# Patient Record
Sex: Male | Born: 1955 | Race: White | Hispanic: No | Marital: Married | Smoking: Former smoker
Health system: Southern US, Community
[De-identification: ages and names within clinical notes are randomized; demographics above are authoritative.]

## PROBLEM LIST (undated history)

## (undated) DIAGNOSIS — G629 Polyneuropathy, unspecified: Secondary | ICD-10-CM

## (undated) DIAGNOSIS — I10 Essential (primary) hypertension: Secondary | ICD-10-CM

## (undated) DIAGNOSIS — C3490 Malignant neoplasm of unspecified part of unspecified bronchus or lung: Secondary | ICD-10-CM

## (undated) HISTORY — PX: SPINE SURGERY: SHX786

---

## 1998-10-26 ENCOUNTER — Emergency Department (HOSPITAL_COMMUNITY): Admission: EM | Admit: 1998-10-26 | Discharge: 1998-10-26 | Payer: Self-pay | Admitting: Emergency Medicine

## 2002-11-18 ENCOUNTER — Emergency Department (HOSPITAL_COMMUNITY): Admission: EM | Admit: 2002-11-18 | Discharge: 2002-11-18 | Payer: Self-pay | Admitting: Emergency Medicine

## 2013-06-13 ENCOUNTER — Emergency Department (HOSPITAL_COMMUNITY)
Admission: EM | Admit: 2013-06-13 | Discharge: 2013-06-13 | Disposition: A | Discharge status: Home / Self Care | Payer: Non-veteran care | Attending: Emergency Medicine | Admitting: Emergency Medicine

## 2013-06-13 ENCOUNTER — Encounter (HOSPITAL_COMMUNITY): Payer: Self-pay | Admitting: Emergency Medicine

## 2013-06-13 DIAGNOSIS — Z79899 Other long term (current) drug therapy: Secondary | ICD-10-CM | POA: Insufficient documentation

## 2013-06-13 DIAGNOSIS — G5600 Carpal tunnel syndrome, unspecified upper limb: Secondary | ICD-10-CM | POA: Insufficient documentation

## 2013-06-13 DIAGNOSIS — Z7982 Long term (current) use of aspirin: Secondary | ICD-10-CM | POA: Insufficient documentation

## 2013-06-13 DIAGNOSIS — I10 Essential (primary) hypertension: Secondary | ICD-10-CM | POA: Insufficient documentation

## 2013-06-13 DIAGNOSIS — F17200 Nicotine dependence, unspecified, uncomplicated: Secondary | ICD-10-CM | POA: Insufficient documentation

## 2013-06-13 DIAGNOSIS — G5601 Carpal tunnel syndrome, right upper limb: Secondary | ICD-10-CM

## 2013-06-13 HISTORY — DX: Essential (primary) hypertension: I10

## 2013-06-13 NOTE — ED Notes (Signed)
Pt complains of numbness that started today in his fingers and is now in his hand only on the left side, no other symptoms

## 2013-06-13 NOTE — Discharge Instructions (Signed)
You need an EMG/nerve conduction velocity test  Carpal Tunnel Syndrome The carpal tunnel is an area under the skin of the palm of your hand. Nerves, blood vessels, and strong tissues (tendons) pass through the tunnel. The tunnel can become puffy (swollen). If this happens, a nerve can be pinched in the wrist. This causes carpal tunnel syndrome.  HOME CARE  Take all medicine as told by your doctor.  If you were given a splint, wear it as told. Wear it at night or at times when your doctor told you to.  Rest your wrist from the activity that causes your pain.  Put ice on your wrist after long periods of wrist activity.  Put ice in a plastic bag.  Place a towel between your skin and the bag.  Leave the ice on for 15-20 minutes, 03-04 times a day.  Keep all doctor visits as told. GET HELP RIGHT AWAY IF:  You have new problems you cannot explain.  Your problems get worse and medicine does not help. MAKE SURE YOU:   Understand these instructions.  Will watch your condition.  Will get help right away if you are not doing well or get worse. Document Released: 03/13/2011 Document Revised: 06/16/2011 Document Reviewed: 03/13/2011 [facility] Patient Information 2014 [place], [place].  Carpal Tunnel Release Carpal tunnel release is done to relieve the pressure on the nerves and tendons on the bottom side of your wrist.  LET YOUR CAREGIVER KNOW ABOUT:   Allergies to food or medicine.  Medicines taken, including vitamins, herbs, eyedrops, over-the-counter medicines, and creams.  Use of steroids (by mouth or creams).  Previous problems with anesthetics or numbing medicines.  History of bleeding problems or blood clots.  Previous surgery.  Other health problems, including diabetes and kidney problems.  Possibility of pregnancy, if this applies. RISKS AND COMPLICATIONS  Some problems that may happen after this procedure include:  Infection.  Damage to the nerves, arteries or  tendons could occur. This would be very uncommon.  Bleeding. BEFORE THE PROCEDURE   This surgery may be done while you are asleep (general anesthetic) or may be done under a block where only your forearm and the surgical area is numb.  If the surgery is done under a block, the numbness will gradually wear off within several hours after surgery. HOME CARE INSTRUCTIONS   Have a responsible person with you for 24 hours.  Do not drive a car or use public transportation for 24 hours.  Only take over-the-counter or prescription medicines for pain, discomfort, or fever as directed by your caregiver. Take them as directed.  You may put ice on the palm side of the affected wrist.  Put ice in a plastic bag.  Place a towel between your skin and the bag.  Leave the ice on for 20 to 30 minutes, 4 times per day.  If you were given a splint to keep your wrist from bending, use it as directed. It is important to wear the splint at night or as directed. Use the splint for as long as you have pain or numbness in your hand, arm, or wrist. This may take 1 to 2 months.  Keep your hand raised (elevated) above the level of your heart as much as possible. This keeps swelling down and helps with discomfort.  Change bandages (dressings) as directed.  Keep the wound clean and dry. SEEK MEDICAL CARE IF:   You develop pain not relieved with medications.  You develop numbness of your  hand.  You develop bleeding from your surgical site.  You have an oral temperature above 102 F (38.9 C).  You develop redness or swelling of the surgical site.  You develop new, unexplained problems. SEEK IMMEDIATE MEDICAL CARE IF:   You develop a rash.  You have difficulty breathing.  You develop any reaction or side effects to medications given. Document Released: 06/14/2003 Document Revised: 06/16/2011 Document Reviewed: 01/28/2007 [facility] Patient Information 2014 [place].

## 2013-06-14 NOTE — ED Provider Notes (Signed)
CSN: [id number]     Arrival date & time 06/13/13  2048 History   First MD Initiated Contact with Patient 06/13/13 2157     Chief Complaint  Patient presents with  . Numbness      HPI Patient presents emergency department with chief complaint of numbness to his right hand which started this morning.  Initially started in his right thumb on the ulnar surface and then spread to the index and long finger and palmar aspect of his hand consistent with a median nerve distribution.  Patient denies any weakness any difficulty walking or speaking.  Patient denies any headache.  Her other symptoms of stroke or TIA.  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  He works in [employer].  Has a past medical history of hypertension and is a smoker with occasional use of alcohol. Past Medical History  Diagnosis Date  . Hypertension    History reviewed. No pertinent past surgical history. History reviewed. No pertinent family history. History  Substance Use Topics  . Smoking status: Current Every Day Smoker  . Smokeless tobacco: Not on file  . Alcohol Use: Yes    Review of Systems  Neurological: Negative for dizziness, seizures, facial asymmetry, speech difficulty, weakness and headaches.  All other systems reviewed and are negative.      Allergies  Review of patient's allergies indicates no known allergies.  Home Medications   Current Outpatient Rx  Name  Route  Sig  Dispense  Refill  . aspirin EC 81 MG tablet   Oral   Take 81 mg by mouth every morning.         Marland Kitchen lisinopril (PRINIVIL,ZESTRIL) 10 MG tablet   Oral   Take 10 mg by mouth daily.         . Multiple Vitamin (MULTIVITAMIN WITH MINERALS) TABS tablet   Oral   Take 1 tablet by mouth every morning.          BP 156/84  Pulse 77  Temp(Src) 98.5 F (36.9 C) (Oral)  Resp 18  Ht 6\' 1"  (1.854 m)  Wt 170 lb (77.111 kg)  BMI 22.43 kg/m2  SpO2 98% Physical Exam  Nursing note and vitals reviewed. Constitutional: He is oriented  to person, place, and time. He appears well-developed and well-nourished. No distress.  HENT:  Head: Normocephalic and atraumatic.  Eyes: Pupils are equal, round, and reactive to light.  Neck: Normal range of motion.  Cardiovascular: Normal rate and intact distal pulses.   Pulmonary/Chest: No respiratory distress.  Abdominal: Normal appearance. He exhibits no distension.  Musculoskeletal: Normal range of motion.  Neurological: He is alert and oriented to person, place, and time. He has normal strength. A sensory deficit (distribution of R median nerve) is present. No cranial nerve deficit. He displays a negative Romberg sign. Gait normal.  Skin: Skin is warm and dry. No rash noted.  Psychiatric: He has a normal mood and affect. His behavior is normal.    ED Course  Procedures (including critical care time) Labs Review Labs Reviewed - No data to display   The history and physical exam are consistent with carpal tunnel or median nerve entrapment.  Plan at this time is placed the patient in a wrist immobilizer with followup instructions for further workup including a EMG and nerve conduction velocity.   MDM   Final diagnoses:  Carpal tunnel syndrome on right        Dot Lanes, MD 06/14/13 [id number]

## 2014-06-09 ENCOUNTER — Ambulatory Visit (INDEPENDENT_AMBULATORY_CARE_PROVIDER_SITE_OTHER): Payer: Non-veteran care

## 2014-06-09 ENCOUNTER — Ambulatory Visit (INDEPENDENT_AMBULATORY_CARE_PROVIDER_SITE_OTHER): Payer: Non-veteran care | Admitting: Family Medicine

## 2014-06-09 VITALS — BP 138/86 | HR 78 | Temp 98.2°F | Resp 17 | Ht 71.0 in | Wt 176.0 lb

## 2014-06-09 DIAGNOSIS — Z720 Tobacco use: Secondary | ICD-10-CM | POA: Diagnosis not present

## 2014-06-09 DIAGNOSIS — M545 Low back pain, unspecified: Secondary | ICD-10-CM

## 2014-06-09 DIAGNOSIS — R1032 Left lower quadrant pain: Secondary | ICD-10-CM

## 2014-06-09 DIAGNOSIS — M25552 Pain in left hip: Secondary | ICD-10-CM

## 2014-06-09 DIAGNOSIS — M79605 Pain in left leg: Principal | ICD-10-CM

## 2014-06-09 LAB — POCT UA - MICROSCOPIC ONLY
Bacteria, U Microscopic: NEGATIVE
Casts, Ur, LPF, POC: NEGATIVE
Crystals, Ur, HPF, POC: NEGATIVE
EPITHELIAL CELLS, URINE PER MICROSCOPY: NEGATIVE
Mucus, UA: NEGATIVE
RBC, URINE, MICROSCOPIC: NEGATIVE
WBC, UR, HPF, POC: NEGATIVE
Yeast, UA: NEGATIVE

## 2014-06-09 LAB — POCT URINALYSIS DIPSTICK
Bilirubin, UA: NEGATIVE
Blood, UA: NEGATIVE
GLUCOSE UA: NEGATIVE
Ketones, UA: NEGATIVE
Leukocytes, UA: NEGATIVE
Nitrite, UA: NEGATIVE
Protein, UA: NEGATIVE
Spec Grav, UA: 1.01
UROBILINOGEN UA: 0.2
pH, UA: 7

## 2014-06-09 MED ORDER — CYCLOBENZAPRINE HCL 10 MG PO TABS: 10.0000 mg | ORAL_TABLET | Freq: Two times a day (BID) | ORAL | Status: DC | PRN

## 2014-06-09 MED ORDER — PREDNISONE 20 MG PO TABS: ORAL_TABLET | ORAL | Status: DC

## 2014-06-09 NOTE — Patient Instructions (Signed)
Use the prednisone as directed- while you are taking this no NSAID medications such as ibuprofen or aleve-  Tylenol is ok though Use the flexeril as needed for pain at night- it is a muscle relaxer and will make you sleepy, do not use it when you are driving Please let me know if you do not feel better in the next couple of days- Sooner if worse.

## 2014-06-09 NOTE — Progress Notes (Addendum)
[facility] [address], [place] [address] [phone]  Date:  06/09/2014   Name:  Gregory Chang   DOB:  [birth year]   MRN:  [id number]  PCP:  No primary care provider on file.    Chief Complaint: Back Pain and Groin Pain   History of Present Illness:  Gregory Chang is a 59 y.o. very pleasant male patient who presents with the following:  Here today as a new patient.  2 night ago he woke up to urinate- "it felt like I had pulled my left groin muscle."  He went back to bed, but the pain seemed to get worse while he was at work yesterday, and it seemed to move into his left hip and lower back.  He has noted some back pain for 5-6 weeks.   Laminectomy at L4/5 in the early 1990s.  Since then he has had intermittent back pain    He has not noted a groin bulge, but he does have pain when he coughs in his groin.  He has a known umbilical hernia hernia No pain with urination.   No blood in his urine.  He has never had a kidney stone.   No testicular pain.   No falls or other trauma.   No leg weakness or numbness, no bowel or bladder control concerns   He took a taper of prednisone which lasted 9 days, finished about 2 weeks ago.  He got this from a friend.  It did help while he was taking it  There are no active problems to display for this patient.   Past Medical History  Diagnosis Date  . Hypertension     Past Surgical History  Procedure Laterality Date  . Spine surgery      History  Substance Use Topics  . Smoking status: Current Every Day Smoker -- 1.00 packs/day for 45 years    Types: Cigarettes  . Smokeless tobacco: Not on file  . Alcohol Use: 0.0 oz/week    0 Standard drinks or equivalent per week    Family History  Problem Relation Age of Onset  . Hypertension Brother     No Known Allergies  Medication list has been reviewed and updated.  Current Outpatient Prescriptions on File Prior to Visit  Medication Sig Dispense  Refill  . aspirin EC 81 MG tablet Take 81 mg by mouth every morning.    Marland Kitchen lisinopril (PRINIVIL,ZESTRIL) 10 MG tablet Take 10 mg by mouth daily.    . Multiple Vitamin (MULTIVITAMIN WITH MINERALS) TABS tablet Take 1 tablet by mouth every morning.     No current facility-administered medications on file prior to visit.    Review of Systems:  As per HPI- otherwise negative.   Physical Examination: Filed Vitals:   06/09/14 1428  BP: 138/86  Pulse: 78  Temp: 98.2 F (36.8 C)  Resp: 17   Filed Vitals:   06/09/14 1428  Height: 5\' 11"  (1.803 m)  Weight: 176 lb (79.833 kg)   Body mass index is 24.56 kg/(m^2). Ideal Body Weight: Weight in (lb) to have BMI = 25: 178.9  GEN: WDWN, NAD, Non-toxic, A & O x 3, tall build, cigarette odor HEENT: Atraumatic, Normocephalic. Neck supple. No masses, No LAD. Ears and Nose: No external deformity. CV: RRR, No M/G/R. No JVD. No thrill. No extra heart sounds. PULM: CTA B, no wheezes, crackles, rhonchi. No retractions. No resp. distress. No accessory muscle use. ABD: S, NT, ND, +  BS. No rebound. No HSM.  Small umbilical hernia which is non- tender EXTR: No c/c/e NEURO Normal gait.  PSYCH: Normally interactive. Conversant. Not depressed or anxious appearing.  Calm demeanor.  Gu: no inguinal hernia. He does have some atrophy of the right testicle which he states is due to "epidimymitis a long time ago."  Otherwise normal testicles and penis.   He has mild tenderness over the left buttock/ sciatic notch area but negative bilateral SLR, normal spine flexion and extension, normal BLE strength, sensation and DTR   [facility] reading (PRIMARY) by  Dr. Lorelei Pont. Lumbar spine: s/p lumbar laminectomy.  Degenerative change worst at L5S1.  Left hip: negative  LEFT HIP (WITH PELVIS) 2-3 VIEWS  COMPARISON: None.  FINDINGS: Old bilateral obturator ring fractures are present. Suspect an old sacral fracture or as well. The pelvis appears narrow. There is mild LEFT  hip osteoarthritis with tiny marginal osteophytes. No acute fracture. Tiny atherosclerotic calcifications are present. L4-L5 disc space narrowing.  IMPRESSION: No acute osseous abnormality. 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 Mild LEFT hip osteoarthritis.  LUMBAR SPINE - COMPLETE 4+ VIEW  COMPARISON: None.  FINDINGS: Lumbar vertebrae are numbered with the lowest segmented appearing vertebral lateral views L5. This may be a transitional vertebra. No acute soft tissue bony abnormality identified. Mild diffuse degenerative change. Normal alignment. Aortoiliac atherosclerotic vascular disease.  IMPRESSION: 1. Diffuse osteopenia and degenerative change. No acute abnormality.  2. Aortoiliac atherosclerotic vascular disease.  Results for orders placed or performed in visit on 06/09/14  Urine culture  Result Value Ref Range   Colony Count NO GROWTH    Organism ID, Bacteria NO GROWTH   POCT UA - Microscopic Only  Result Value Ref Range   WBC, Ur, HPF, POC neg    RBC, urine, microscopic neg    Bacteria, U Microscopic neg    Mucus, UA neg    Epithelial cells, urine per micros neg    Crystals, Ur, HPF, POC neg    Casts, Ur, LPF, POC neg    Yeast, UA neg   POCT urinalysis dipstick  Result Value Ref Range   Color, UA yellow    Clarity, UA clear    Glucose, UA neg    Bilirubin, UA neg    Ketones, UA neg    Spec Grav, UA 1.010    Blood, UA neg    pH, UA 7.0    Protein, UA neg    Urobilinogen, UA 0.2    Nitrite, UA neg    Leukocytes, UA Negative      Assessment and Plan: Low back pain radiating to left lower extremity - Plan: DG Lumbar Spine Complete, predniSONE (DELTASONE) 20 MG tablet, cyclobenzaprine (FLEXERIL) 10 MG tablet  Pain in left hip - Plan: DG HIP UNILAT WITH PELVIS 2-3 VIEWS LEFT  Pain in the groin, left - Plan: POCT UA - Microscopic Only, POCT urinalysis dipstick, Urine culture  Lower back pain with some radiation to the left leg, although no weakness  or numbness.  Treat with a prednisone taper, flexeril as needed for pain.  He will follow-up if not feeling better soon Urine is very benign so doubt kidney stone or urinary tract infection Groin pain: at this time no evidence of an inguinal hernia, but tension a the inguinal ring seems the likely cause of his discomfort.  This is now improved and he will watch for recurrence. Warned regarding signs of strangulation or incarceration   Signed Lamar Blinks, MD  Send copy of  results to his PCP, Dr. Dell Ponto, with the [facility] in [place]

## 2014-06-10 ENCOUNTER — Encounter: Payer: Self-pay | Admitting: Family Medicine

## 2014-06-10 LAB — URINE CULTURE
Colony Count: NO GROWTH
Organism ID, Bacteria: NO GROWTH

## 2014-06-15 ENCOUNTER — Ambulatory Visit (INDEPENDENT_AMBULATORY_CARE_PROVIDER_SITE_OTHER): Payer: Non-veteran care | Admitting: Family Medicine

## 2014-06-15 VITALS — BP 156/74 | HR 86 | Temp 97.9°F | Resp 16 | Ht 71.0 in | Wt 184.0 lb

## 2014-06-15 DIAGNOSIS — M545 Low back pain, unspecified: Secondary | ICD-10-CM

## 2014-06-15 DIAGNOSIS — R202 Paresthesia of skin: Secondary | ICD-10-CM | POA: Diagnosis not present

## 2014-06-15 DIAGNOSIS — M79605 Pain in left leg: Principal | ICD-10-CM

## 2014-06-15 DIAGNOSIS — R208 Other disturbances of skin sensation: Secondary | ICD-10-CM | POA: Diagnosis not present

## 2014-06-15 DIAGNOSIS — R200 Anesthesia of skin: Secondary | ICD-10-CM

## 2014-06-15 LAB — GLUCOSE, POCT (MANUAL RESULT ENTRY): POC Glucose: 130 mg/dl — AB (ref 70–99)

## 2014-06-15 MED ORDER — PREDNISONE 20 MG PO TABS: ORAL_TABLET | ORAL | Status: DC

## 2014-06-15 NOTE — Patient Instructions (Addendum)
Your symptoms still appear to be coming from your back. Your strength and reflexes in the legs appear to be ok/equal today, and I do not see any swelling in your legs.  We can try a higher dose of prednisone for the next few days, then tapering down. If your leg symptoms are not improving in next 2 or 3 days, or any worsening of your symptoms - return here or emergency room. If you have any numbness in groin, loss of control of urine or stool - go directly to an emergency room. You may need an MRI of your spine or back specialist to see you if not improving, and we can refer you for this, but may be best if your primary provider coordinates this.  THe contact information I obtained for Dr. Henrene Pastor was [phone], extension [phone].   Blood sugar slightly elevated in office, but may be ok if not fasting. Follow up with your primary provider to recheck this fasting.   Return to the clinic or go to the nearest emergency room if any of your symptoms worsen or new symptoms occur.

## 2014-06-15 NOTE — Progress Notes (Addendum)
Subjective:    Patient ID: Gregory Chang, male    DOB: April 28, 1955, 59 y.o.   MRN: [id number] This chart was scribed for Merri Ray, MD by Zola Button, Medical Scribe. This patient was seen in Room 11 and the patient's care was started at 12:23 PM.    HPI HPI Comments: Gregory Chang is a 59 y.o. male who presents to the [facility] complaining of left leg numbness.  He was seen by Dr. Lorelei Pont 6 days ago for low back pain to the left of the hip and leg pain. Hx of laminectomy of L4/L5 in 1990s. He is also having some pain in the groin with coughing. Per her note, he had also taken a 9 day taper of prednisone 2 weeks prior that helped his symptoms. Reported back pain for 5-6 weeks. He had XRs of his left hip and spine that showed old pelvic fractures and mild left hip OA. Normal UA. He was treated with a prednisone taper and flexeril as well as precautions for a possible inguinal hernia. Copy of results from last visit sent to PCP Dr. Henrene Pastor with a [facility] in [place].  He states that now his left leg has gone numb, including his toes and groin, as of yesterday with soreness to the front of the lower part of the leg. The numbness is worse in the lower part of his leg. He did have some leg numbness after his laminectomy, but he states he did not have numbness to the whole leg. Patient also has some weakness, stating that he cannot control the muscles of his leg. The prednisone and muscle relaxer did not really help his symptoms. Patient denies loss of bladder or bowel control. He also denies hx of blood clots and FMHx of blood clots.  Review of Systems  Neurological: Positive for weakness and numbness.       Objective:   Physical Exam  Constitutional: He is oriented to person, place, and time. He appears well-developed and well-nourished. No distress.  HENT:  Head: Normocephalic and atraumatic.  Mouth/Throat: Oropharynx is clear and moist. No oropharyngeal  exudate.  Eyes: Pupils are equal, round, and reactive to light.  Neck: Neck supple.  Cardiovascular: Normal rate.   Pulmonary/Chest: Effort normal.  Genitourinary:  Slight discomfort along the medial left thigh, but no hernia palpated in the groin.  Musculoskeletal: He exhibits tenderness. He exhibits no edema.  Unable to heel walk on the left leg, but is able to toe walk. Tender along left sciatic notch. Flexion slightly limited at 90 degrees, otherwise normal ROM. Calves nontender. Homans negative. Calves circumference overall equal: 36 cm on the left, 37 cm on the right at 15 cm below the patella.  Neurological: He is alert and oriented to person, place, and time. No cranial nerve deficit.  Patellar reflexes 2+ bilaterally. Achilles reflexes 1+ bilaterally. Babinski negative bilaterally. Slight decreased sensation to tips of toes on left foot, but DP pulse 2+ and cap refill less than 1 second.    Skin: Skin is warm and dry. No rash noted.  Well healed scar on the lower lumbar spine.  Psychiatric: He has a normal mood and affect. His behavior is normal.  Nursing note and vitals reviewed.  Filed Vitals:   06/15/14 1058  BP: 156/74  Pulse: 86  Temp: 97.9 F (36.6 C)  TempSrc: Oral  Resp: 16  Height: 5\' 11"  (1.803 m)  Weight: 184 lb (83.462 kg)  SpO2: 98%  Results for orders placed or performed in visit on 06/15/14  POCT glucose (manual entry)  Result Value Ref Range   POC Glucose 130 (A) 70 - 99 mg/dl  nonfasting.      Assessment & Plan:   Gregory Chang is a 59 y.o. male Low back pain radiating to left leg - Plan: predniSONE (DELTASONE) 20 MG tablet  Numbness in left leg - Plan: POCT glucose (manual entry), predniSONE (DELTASONE) 20 MG tablet  Numbness and tingling of left leg - Plan: POCT glucose (manual entry)  Still suspected lumbar source with radiculopathy. Has strength with testing and ambulating ok - discomfort with heel walk, but reflexes equal, pulses  intact distally, no calf swelling or pain. No cauda equina sx's.   -trial of increased prednisone dose temporarily., has flexeril if needed.   -if not improving with this dose, or worsening sooner - RTC or ER. Cauda equina precuations discussed.   -may need MRI, but initial prednisone increase trail as above.   -follow up with PCP re: glucose, but not fasting today.   Meds ordered this encounter  Medications  . predniSONE (DELTASONE) 20 MG tablet    Sig: 3 by mouth for 2 days, then 2 by mouth for 2 days, then 1 by mouth for 2 days, then 1/2 by mouth for 2 days.    Dispense:  13 tablet    Refill:  0   Patient Instructions  Your symptoms still appear to be coming from your back. Your strength and reflexes in the legs appear to be ok/equal today, and I do not see any swelling in your legs.  We can try a higher dose of prednisone for the next few days, then tapering down. If your leg symptoms are not improving in next 2 or 3 days, or any worsening of your symptoms - return here or emergency room. If you have any numbness in groin, loss of control of urine or stool - go directly to an emergency room. You may need an MRI of your spine or back specialist to see you if not improving, and we can refer you for this, but may be best if your primary provider coordinates this.  THe contact information I obtained for Dr. Henrene Pastor was [phone], extension [phone].   Blood sugar slightly elevated in office, but may be ok if not fasting. Follow up with your primary provider to recheck this fasting.   Return to the clinic or go to the nearest emergency room if any of your symptoms worsen or new symptoms occur.      I personally performed the services described in this documentation, which was scribed in my presence. The recorded information has been reviewed and considered, and addended by me as needed.

## 2014-12-17 ENCOUNTER — Encounter (HOSPITAL_BASED_OUTPATIENT_CLINIC_OR_DEPARTMENT_OTHER): Payer: Self-pay | Admitting: *Deleted

## 2014-12-17 ENCOUNTER — Emergency Department (HOSPITAL_BASED_OUTPATIENT_CLINIC_OR_DEPARTMENT_OTHER)
Admission: EM | Admit: 2014-12-17 | Discharge: 2014-12-17 | Disposition: A | Discharge status: Home / Self Care | Payer: Non-veteran care | Attending: Emergency Medicine | Admitting: Emergency Medicine

## 2014-12-17 DIAGNOSIS — Y9389 Activity, other specified: Secondary | ICD-10-CM | POA: Diagnosis not present

## 2014-12-17 DIAGNOSIS — Y9289 Other specified places as the place of occurrence of the external cause: Secondary | ICD-10-CM | POA: Insufficient documentation

## 2014-12-17 DIAGNOSIS — I10 Essential (primary) hypertension: Secondary | ICD-10-CM | POA: Insufficient documentation

## 2014-12-17 DIAGNOSIS — Z87891 Personal history of nicotine dependence: Secondary | ICD-10-CM | POA: Diagnosis not present

## 2014-12-17 DIAGNOSIS — Z79899 Other long term (current) drug therapy: Secondary | ICD-10-CM | POA: Insufficient documentation

## 2014-12-17 DIAGNOSIS — Y998 Other external cause status: Secondary | ICD-10-CM | POA: Diagnosis not present

## 2014-12-17 DIAGNOSIS — S80852A Superficial foreign body, left lower leg, initial encounter: Secondary | ICD-10-CM

## 2014-12-17 DIAGNOSIS — W01198A Fall on same level from slipping, tripping and stumbling with subsequent striking against other object, initial encounter: Secondary | ICD-10-CM | POA: Diagnosis not present

## 2014-12-17 MED ORDER — CEPHALEXIN 500 MG PO CAPS: 500.0000 mg | ORAL_CAPSULE | Freq: Three times a day (TID) | ORAL | Status: DC

## 2014-12-17 MED ORDER — LIDOCAINE-EPINEPHRINE (PF) 2 %-1:200000 IJ SOLN
20.0000 mL | Freq: Once | INTRAMUSCULAR | Status: AC
  Administered 2014-12-17: 20 mL
  Filled 2014-12-17: qty 20

## 2014-12-17 NOTE — ED Notes (Signed)
Patient states that he fell and a stick punctured his left lower leg lower lateral leg

## 2014-12-17 NOTE — ED Provider Notes (Signed)
CSN: [id number]     Arrival date & time 12/17/14  5465 History   First MD Initiated Contact with Patient 12/17/14 [phone]     Chief Complaint  Patient presents with  . Puncture Wound      HPI Patient presents to the emergency department with complaints of foreign body in his left lower extremity.  He was working in the bushes when he tripped and fell in a piece of the branch got stuck in his lower leg.  Emergency physician on scene attempted to remove the foreign body however she was unsuccessful to completely remove this and thus the patient was sent to the ER for further evaluation.  Patient denies numbness tingling or weakness of his left lower extremity.   Past Medical History  Diagnosis Date  . Hypertension    Past Surgical History  Procedure Laterality Date  . Spine surgery     Family History  Problem Relation Age of Onset  . Hypertension Brother    Social History  Substance Use Topics  . Smoking status: Former Smoker -- 1.00 packs/day for 45 years  . Smokeless tobacco: None  . Alcohol Use: 0.0 oz/week    0 Standard drinks or equivalent per week    Review of Systems  All other systems reviewed and are negative.     Allergies  Review of patient's allergies indicates no known allergies.  Home Medications   Prior to Admission medications   Medication Sig Start Date End Date Taking? Authorizing Provider  GABAPENTIN, ONCE-DAILY, PO Take by mouth.   Yes Historical Provider, MD  NAPROXEN PO Take by mouth.   Yes Historical Provider, MD  tamsulosin (FLOMAX) 0.4 MG CAPS capsule Take 0.4 mg by mouth.   Yes Historical Provider, MD  aspirin EC 81 MG tablet Take 81 mg by mouth every morning.    Historical Provider, MD  cephALEXin (KEFLEX) 500 MG capsule Take 1 capsule (500 mg total) by mouth 3 (three) times daily. 12/17/14   Jola Schmidt, MD  cyclobenzaprine (FLEXERIL) 10 MG tablet Take 1 tablet (10 mg total) by mouth 2 (two) times daily as needed for muscle spasms. 06/09/14    Gay Filler Copland, MD  lisinopril (PRINIVIL,ZESTRIL) 10 MG tablet Take 10 mg by mouth daily.    Historical Provider, MD  Multiple Vitamin (MULTIVITAMIN WITH MINERALS) TABS tablet Take 1 tablet by mouth every morning.    Historical Provider, MD  predniSONE (DELTASONE) 20 MG tablet 3 by mouth for 2 days, then 2 by mouth for 2 days, then 1 by mouth for 2 days, then 1/2 by mouth for 2 days. 06/15/14   Wendie Agreste, MD   BP 175/88 mmHg  Pulse 70  Temp(Src) 98.3 F (36.8 C) (Oral)  Resp 18  SpO2 99% Physical Exam  Constitutional: He is oriented to person, place, and time. He appears well-developed and well-nourished.  HENT:  Head: Normocephalic.  Eyes: EOM are normal.  Neck: Normal range of motion.  Pulmonary/Chest: Effort normal.  Abdominal: He exhibits no distension.  Musculoskeletal: Normal range of motion.  Normal pulses left foot.  Foreign body in the left lateral lower aspect of his lower leg.  No bleeding.  No surrounding erythema  Neurological: He is alert and oriented to person, place, and time.  Psychiatric: He has a normal mood and affect.  Nursing note and vitals reviewed.   ED Course  FOREIGN BODY REMOVAL Performed by: Jola Schmidt Authorized by: Jola Schmidt Consent: Verbal consent obtained. Risks and benefits: risks,  benefits and alternatives were discussed Consent given by: patient Time out: Immediately prior to procedure a "time out" was called to verify the correct patient, procedure, equipment, support staff and site/side marked as required. Intake: left lower leg. Anesthesia: local infiltration Local anesthetic: lidocaine 2% with epinephrine Anesthetic total: 5 ml Complexity: complex 1 objects recovered. Objects recovered: wood Post-procedure assessment: foreign body removed Patient tolerance: Patient tolerated the procedure well with no immediate complications Comments: 3cm surgical incision made to remove FB. Bleeding controlled. Single 3-0 prolene  stitch placed to close the gaping surgical wound. Irrigated extensively at the bedside   (including critical care time) Labs Review Labs Reviewed - No data to display  Imaging Review No results found. I have personally reviewed and evaluated these images and lab results as part of my medical decision-making.   EKG Interpretation None      MDM   Final diagnoses:  Foreign body in left lower extremity, initial encounter   Foreign body removed.  Infection warnings given.  Surgical incision to cut down to remove foreign body.  Single stitch placed to close the significant gaping wound caused by the surgical incision.  There is still ample room if any pus were to accumulate for it to drain.  This was irrigated extensively at the bedside.  Placed on antibiotics.    Jola Schmidt, MD 12/17/14 1041

## 2014-12-17 NOTE — Discharge Instructions (Signed)
Sliver Removal You have had a sliver (splinter) removed. This has caused a wound that extends through some or all layers of the skin and possibly into the subcutaneous tissue. This is the tissue just beneath the skin. Because these wounds can not be cleaned well, it is necessary to watch closely for infection. AFTER THE PROCEDURE  If a cut (incision) was necessary to remove this, it may have been repaired for you by your caregiver either with suturing, stapling, or adhesive strips. These keep together the skin edges and allow better and faster healing. HOME CARE INSTRUCTIONS   A dressing may have been applied. This may be changed once per day or as instructed. If the dressing sticks, it may be soaked off with a gauze pad or clean cloth that has been dampened with soapy water or hydrogen peroxide.  It is difficult to remove all slivers or foreign bodies as they may break or splinter into smaller pieces. Be aware that your body will work to remove the foreign substance. That is, the foreign body may work itself out of the wound. That is normal.  Watch for signs of infection and notify your caregiver if you suspect a sliver or foreign body remains in the wound.  You may have received a recommendation to follow up with your physician or a specialist. It is very important to call for or keep follow-up appointments in order to avoid infection or other complications.  Only take over-the-counter or prescription medicines for pain, discomfort, or fever as directed by your caregiver.  If antibiotics were prescribed, be sure to finish all of the medicine. If you did not receive a tetanus shot today because you did not recall when your last one was given, check with your caregiver in the next day or two during follow up to determine if one is needed. SEEK MEDICAL CARE IF:   The area around the wound has new or worsening redness or tenderness.  Pus is coming from the wound  There is a foul smell from the  wound or dressing  The edges of a wound that had been repaired break open SEEK IMMEDIATE MEDICAL CARE IF:   Red streaks are coming from the wound  An unexplained oral temperature above 102 F (38.9 C) develops. Document Released: 03/21/2000 Document Revised: 06/16/2011 Document Reviewed: 11/08/2007 [facility] Patient Information 2015 [place], [place]. This information is not intended to replace advice given to you by your health care provider. Make sure you discuss any questions you have with your health care provider.

## 2016-02-05 ENCOUNTER — Other Ambulatory Visit: Payer: Self-pay | Admitting: Radiation Oncology

## 2016-02-05 ENCOUNTER — Ambulatory Visit
Admission: RE | Admit: 2016-02-05 | Discharge: 2016-02-05 | Disposition: A | Discharge status: Home / Self Care | Payer: Non-veteran care | Source: Ambulatory Visit | Attending: Radiation Oncology | Admitting: Radiation Oncology

## 2016-02-05 DIAGNOSIS — C801 Malignant (primary) neoplasm, unspecified: Secondary | ICD-10-CM

## 2016-02-07 NOTE — Progress Notes (Signed)
Thoracic Location of Tumor / Histology:   Left Supraclavicular  Lymph node core biopsy (Stage IV lung ca) Large mediastinal mass , Right lower lobe, bi lateral neck   Patient presented  months ago with symptoms of: persist ant cough,  hemoptysis  ,   Biopsies of  (if applicable) revealed: Left lymph node left supraclavicular core bx: Metastatic squamous cell carcinoma   Tobacco/Marijuana/Snuff/ETOH use: 1ppd x 45 years quit 11/2014, quit alcohol 10/2015,  Smokes marijuana  Occasionally  1.5 week to help him sleep  Past/Anticipated interventions by cardiothoracic surgery, if any:   Past/Anticipated interventions by medical oncology, if any:  Dr. Adolm Joseph [facility], [facility]  =[facility] plan, [place] with Gemcitabine day 1& 8 every 3 weeks, palliative on hold till  Radiation consult  Signs/Symptoms  Weight changes, if any loss , not ealting well, fatigued  Respiratory complaints, if any: shortness breath mild exertion, mostly dry congestive cough    Hemoptysis, if any: yes 1/4 cup over past 12 hours   Pain issues, if any: generalized   SAFETY ISSUES: NO  Prior radiation? No  Pacemaker/ICD?  NO  Is the patient on methotrexate? NO  Current Complaints / other details:  [facility] referral from [employer] ,  For consideration palliative radiation prior to starting chemotherapy  Allergies:NKA  BP 103/60 (BP Location: Left Arm, Patient Position: Sitting, Cuff Size: Normal)   Pulse 75   Temp 98.2 F (36.8 C) (Oral)   Resp 20   Ht '5\' 11"'$  (1.803 m)   Wt 179 lb 12.8 oz (81.6 kg)   SpO2 99%   BMI 25.08 kg/m   Wt Readings from Last 3 Encounters:  02/11/16 179 lb 12.8 oz (81.6 kg)  06/15/14 184 lb (83.5 kg)  06/09/14 176 lb (79.8 kg)

## 2016-02-11 ENCOUNTER — Encounter: Payer: Self-pay | Admitting: Radiation Oncology

## 2016-02-11 ENCOUNTER — Ambulatory Visit
Admission: RE | Admit: 2016-02-11 | Discharge: 2016-02-11 | Disposition: A | Discharge status: Home / Self Care | Payer: Non-veteran care | Source: Ambulatory Visit | Attending: Radiation Oncology | Admitting: Radiation Oncology

## 2016-02-11 VITALS — BP 103/60 | HR 75 | Temp 98.2°F | Resp 20 | Ht 71.0 in | Wt 179.8 lb

## 2016-02-11 DIAGNOSIS — R0602 Shortness of breath: Secondary | ICD-10-CM | POA: Diagnosis not present

## 2016-02-11 DIAGNOSIS — Z23 Encounter for immunization: Secondary | ICD-10-CM | POA: Diagnosis not present

## 2016-02-11 DIAGNOSIS — C3431 Malignant neoplasm of lower lobe, right bronchus or lung: Secondary | ICD-10-CM

## 2016-02-11 DIAGNOSIS — Z510 Encounter for antineoplastic radiation therapy: Secondary | ICD-10-CM | POA: Diagnosis present

## 2016-02-11 DIAGNOSIS — C3481 Malignant neoplasm of overlapping sites of right bronchus and lung: Secondary | ICD-10-CM

## 2016-02-11 DIAGNOSIS — Z79899 Other long term (current) drug therapy: Secondary | ICD-10-CM | POA: Insufficient documentation

## 2016-02-11 DIAGNOSIS — Z7982 Long term (current) use of aspirin: Secondary | ICD-10-CM | POA: Insufficient documentation

## 2016-02-11 HISTORY — DX: Malignant neoplasm of unspecified part of unspecified bronchus or lung: C34.90

## 2016-02-11 NOTE — Progress Notes (Signed)
Radiation Oncology         [phone] ________________________________  Name: Gregory Chang MRN: [id number]  Date: 02/11/2016  DOB: 1955-04-15  CC:No primary care provider on file.  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 A diagnosis of Malignant neoplasm of overlapping sites of right lung [facility]) was also pertinent to this visit.  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 male seen at the request of Dr. Manuella Ghazi for a new diagnosis of lung cancer.  The patient has a long history of tobacco abuse. In May 2017, the patient had a worsening cough, increased shortness of breath, and a 10 lb weight loss. Chest X-ray was abnormal. Workup CT of the chest (on 01/03/16) revealed extensive mediastinal and hilar lymphadenopathy with mucosal breakthrough at the carina and bronchus intermedius, a right lower lobe mass (1.7 x 2.3 x 3.1 cm), left supraclavicular lymphadenopathy, and scattered tree in bud abnormalities in the RUL and RML with obstruction of the RLL bronchus.  This prompted a PET scan on 01/18/16. This showed a large central right perihilar mass measuring 3.7 x 5.9 cm (SUV max 39.7), a pulmonary nodule in the medial aspect of the superior segment of the RLL measuring 1.6 x 2.6 cm (SUV max 84.5), hypermetabolic subcarinal adenopathy measuring 3.1 x 3.5 cm (SUV max 24.5) right paratracheal adenopathy measuring 4.3 x 6.5 cm (SUV max 27.0), anterior mediastinal adenopathy measuring 1.6 x 2.3 cm (SUV max 29.7), and bilateral cervical and supraclavicular adenopathy with the largest node being a left supraclavicular node measuring 1.9 x 2.9 cm (SUV max 26.4). An enlarged left supraclavicular node was biopsied on 01/23/16 revealed metastatic squamous cell carcinoma, consistent with  lung origin. PDL1 results are not available at this time. MRI of the brain on 01/23/16 did not show metastasis.  He has met with Dr. Manuella Ghazi in medical oncology at the [facility] and has plans to begin carboplatin/gemcitabine chemotherapy once he completes palliative radiation due to his hemoptysis. He and his wife present today to discuss radiation with Dr. Lisbeth Renshaw.  PREVIOUS RADIATION THERAPY: No   PAST MEDICAL HISTORY:  Past Medical History:  Diagnosis Date  . Hypertension   . Lung cancer ([place])    supraclavicuoar node =metastatic squam,ous cell lung       PAST SURGICAL HISTORY: Past Surgical History:  Procedure Laterality Date  . SPINE SURGERY       FAMILY HISTORY:  Family History  Problem Relation Age of Onset  . Hypertension Brother      SOCIAL HISTORY:  reports that he has quit smoking. He has a 45.00 pack-year smoking history. He does not have any smokeless tobacco history on file. He reports that he drinks alcohol. He reports that he uses drugs about 1 time per week. The patient is retired and lives in [place]. He is accompanied by his wife.   ALLERGIES: Patient has no known allergies.   MEDICATIONS:  Current Outpatient Prescriptions  Medication Sig Dispense Refill  . albuterol (PROVENTIL HFA;VENTOLIN HFA) 108 (90 Base) MCG/ACT inhaler Inhale into the lungs every 6 (six) hours as needed for wheezing or shortness of breath.    Marland Kitchen amLODipine (NORVASC) 5 MG tablet Take 5 mg by mouth daily.    Marland Kitchen amoxicillin-clavulanate (AUGMENTIN) 875-125 MG tablet Take 1 tablet by  mouth 2 (two) times daily.    . codeine 30 MG tablet Take 30 mg by mouth every 6 (six) hours as needed. Take 1 tab q 6 hours prn, take only if having cough symptoms and coughing up blood    . HEPATITIS A-HEP B RECOMB VAC IM Inject 1 mL into the muscle. 70m  IM  For 3 doses,ive 3 doses at 0,1,and 6 months    . vitamin C (ASCORBIC ACID) 500 MG tablet Take 500 mg by mouth daily.    .Marland Kitchenaspirin EC 81 MG tablet  Take 81 mg by mouth every morning.    . cyclobenzaprine (FLEXERIL) 10 MG tablet Take 1 tablet (10 mg total) by mouth 2 (two) times daily as needed for muscle spasms. 30 tablet 0  . GABAPENTIN, ONCE-DAILY, PO Take 2 capsules by mouth 3 (three) times daily. 2 tabs twice a day and bedtime    . Melatonin 10 MG TABS Take 1 tablet by mouth at bedtime as needed.    . Multiple Vitamin (MULTIVITAMIN WITH MINERALS) TABS tablet Take 1 tablet by mouth every morning.    .Marland KitchenNAPROXEN PO Take 220 mg by mouth as needed.     . predniSONE (DELTASONE) 20 MG tablet 3 by mouth for 2 days, then 2 by mouth for 2 days, then 1 by mouth for 2 days, then 1/2 by mouth for 2 days. 13 tablet 0  . tamsulosin (FLOMAX) 0.4 MG CAPS capsule Take 0.4 mg by mouth.     No current facility-administered medications for this encounter.      REVIEW OF SYSTEMS: On review of systems, the patient reports that he is doing okay overall. He reports generalized pain, a 2 week history of hemoptysis of about 200 cc (of blood and sputum) over the past 12 hours, shortness of breath with mild exertion, a dry congestive cough, loss of appetite, weight loss, difficulty swallowing, and fatigue. Denies fevers, chills, or night sweats. He denies any bowel or bladder disturbances, and denies abdominal pain, nausea, or vomiting. He denies any new musculoskeletal or joint aches or pains. A complete review of systems is obtained and is otherwise negative.   PHYSICAL EXAM:  Wt Readings from Last 3 Encounters:  02/11/16 179 lb 12.8 oz (81.6 kg)  06/15/14 184 lb (83.5 kg)  06/09/14 176 lb (79.8 kg)   Temp Readings from Last 3 Encounters:  02/11/16 98.2 F (36.8 C) (Oral)  12/17/14 98.3 F (36.8 C) (Oral)  06/15/14 97.9 F (36.6 C) (Oral)   BP Readings from Last 3 Encounters:  02/11/16 103/60  12/17/14 175/88  06/15/14 (!) 156/74   Pulse Readings from Last 3 Encounters:  02/11/16 75  12/17/14 70  06/15/14 86     Pain scale 0/10 In general  this is a chronically ill appearing Caucasian male in no acute distress. He is alert and oriented x4 and appropriate throughout the examination. HEENT reveals that the patient is normocephalic, atraumatic. EOMs are intact. PERRLA. Skin is intact without any evidence of gross lesions. Cardiovascular exam reveals a regular rate and rhythm, no clicks rubs or murmurs are auscultated. Chest is clear to auscultation bilaterally.  There is a fluctuant lesion on the upper back along the midline consistent with a carbuncle. No punctate center is noted. Lymphatic assessment is performed and reveals shotty adenopathy in the left supraclavicular and cervical regions. No other palpable adenopathy is noted of the axillary or inguinal regions. Abdomen has active bowel sounds in all quadrants and is intact.  The abdomen is soft, non tender, non distended. Lower extremities are negative for pretibial pitting edema, deep calf tenderness, cyanosis or clubbing.  ECOG = 1  0 - Asymptomatic (Fully active, able to carry on all predisease activities without restriction)  1 - Symptomatic but completely ambulatory (Restricted in physically strenuous activity but ambulatory and able to carry out work of a light or sedentary nature. For example, light housework, office work)  2 - Symptomatic, <50% in bed during the day (Ambulatory and capable of all self care but unable to carry out any work activities. Up and about more than 50% of waking hours)  3 - Symptomatic, >50% in bed, but not bedbound (Capable of only limited self-care, confined to bed or chair 50% or more of waking hours)  4 - Bedbound (Completely disabled. Cannot carry on any self-care. Totally confined to bed or chair)  5 - Death   Eustace Pen MM, Creech RH, Tormey DC, et al. [phone]). "Toxicity and response criteria of the [facility]". [place] Oncol. 5 (6): 649-55    LABORATORY DATA:  No results found for: WBC, HGB, HCT, MCV, PLT No results  found for: NA, K, CL, CO2 No results found for: ALT, AST, GGT, ALKPHOS, BILITOT    RADIOGRAPHY: No results found.     IMPRESSION/PLAN: 1. 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 Dr. Lisbeth Renshaw met with the  patient and family about the findings and work-up thus far.  We discussed the natural history of stage IV lung cancer and general treatment, highlighting the role of radiotherapy in the management.  We discussed the available radiation techniques, and focused on the details of logistics and delivery. Dr. Lisbeth Renshaw has recommended 2 weeks of palliative radiation (10 fractions) to the mediastinum/right hilar region to help relieve his hemoptysis.  We reviewed the anticipated acute and late sequelae associated with radiation in this setting. The patient signed a consent form and we retained a copy for our records.  The patient would like to proceed with radiation and has been scheduled for CT simulation this afternoon at 1PM and begin radiation later this week. 2. Carbuncle. The patient will complete his antibiotic course with Augmentin, and keep [place] informed of any progressive symptoms.   The above documentation reflects my direct findings during this shared patient visit. Please see the separate note by Dr. Lisbeth Renshaw on this date for the remainder of the patient's plan of care.    Carola Rhine, PAC  This document serves as a record of services personally performed by Shona Simpson, PA-C and Kyung Rudd, MD. It was created on their behalf by Darcus Austin, a trained medical scribe. The creation of this record is based on the scribe's personal observations and the providers' statements to them. This document has been checked and approved by the attending provider.

## 2016-02-11 NOTE — Progress Notes (Signed)
Please see the Nurse Progress Note in the MD Initial Consult Encounter for this patient. 

## 2016-02-12 NOTE — Addendum Note (Signed)
Encounter addended by: Hayden Pedro, PA-C on: 02/12/2016  4:07 PM<BR>    Actions taken: Sign clinical note

## 2016-02-13 ENCOUNTER — Inpatient Hospital Stay
Admission: RE | Admit: 2016-02-13 | Discharge: 2016-02-13 | Disposition: A | Discharge status: Home / Self Care | Payer: Self-pay | Source: Ambulatory Visit | Attending: Radiation Oncology | Admitting: Radiation Oncology

## 2016-02-13 ENCOUNTER — Ambulatory Visit
Admission: RE | Admit: 2016-02-13 | Discharge: 2016-02-13 | Disposition: A | Discharge status: Home / Self Care | Payer: Non-veteran care | Source: Ambulatory Visit | Attending: Radiation Oncology | Admitting: Radiation Oncology

## 2016-02-13 ENCOUNTER — Encounter: Payer: Self-pay | Admitting: Radiation Oncology

## 2016-02-13 DIAGNOSIS — C3481 Malignant neoplasm of overlapping sites of right bronchus and lung: Secondary | ICD-10-CM

## 2016-02-13 DIAGNOSIS — Z510 Encounter for antineoplastic radiation therapy: Secondary | ICD-10-CM | POA: Diagnosis not present

## 2016-02-13 NOTE — Progress Notes (Signed)
Gregory Chang has completed 1 fractions to his right lung.  He reports having joint soreness and said he thinks it was from an infusion for high calcium yesterday.  He reports shortness of breath especially with activity.  He reports a frequent cough mostly at night.  He reports having hemoptysis about the size of a penny that happens at night or in the morning.  He reports having fatigue.    BP 103/85 (BP Location: Left Arm, Patient Position: Sitting)   Pulse 77   Temp 98.1 F (36.7 C) (Oral)   Ht '5\' 11"'$  (1.803 m)   Wt 179 lb 9.6 oz (81.5 kg)   SpO2 97%   BMI 25.05 kg/m    Wt Readings from Last 3 Encounters:  02/13/16 179 lb 9.6 oz (81.5 kg)  02/11/16 179 lb 12.8 oz (81.6 kg)  06/15/14 184 lb (83.5 kg)

## 2016-02-14 ENCOUNTER — Ambulatory Visit
Admission: RE | Admit: 2016-02-14 | Discharge: 2016-02-14 | Disposition: A | Discharge status: Home / Self Care | Payer: Non-veteran care | Source: Ambulatory Visit | Attending: Radiation Oncology | Admitting: Radiation Oncology

## 2016-02-14 DIAGNOSIS — Z510 Encounter for antineoplastic radiation therapy: Secondary | ICD-10-CM | POA: Diagnosis not present

## 2016-02-14 NOTE — Progress Notes (Signed)
Department of Radiation Oncology  Phone:  [phone] Fax:        [phone]  Weekly Treatment Note    Name: Gregory Chang Date: 02/14/2016 MRN: [id number] DOB: 05/27/1955   Diagnosis:     ICD-9-CM ICD-10-CM   1. Malignant neoplasm of overlapping sites of right lung ([facility]) 162.8 C34.81      Current dose: 3 Gy  Current fraction: 1   MEDICATIONS: Current Outpatient Prescriptions  Medication Sig Dispense Refill  . albuterol (PROVENTIL HFA;VENTOLIN HFA) 108 (90 Base) MCG/ACT inhaler Inhale into the lungs every 6 (six) hours as needed for wheezing or shortness of breath.    Marland Kitchen amLODipine (NORVASC) 5 MG tablet Take 5 mg by mouth daily.    Marland Kitchen amoxicillin-clavulanate (AUGMENTIN) 875-125 MG tablet Take 1 tablet by mouth 2 (two) times daily.    Marland Kitchen aspirin EC 81 MG tablet Take 81 mg by mouth every morning.    . codeine 30 MG tablet Take 30 mg by mouth every 6 (six) hours as needed. Take 1 tab q 6 hours prn, take only if having cough symptoms and coughing up blood    . cyclobenzaprine (FLEXERIL) 10 MG tablet Take 1 tablet (10 mg total) by mouth 2 (two) times daily as needed for muscle spasms. (Patient not taking: Reported on 02/13/2016) 30 tablet 0  . GABAPENTIN, ONCE-DAILY, PO Take 2 capsules by mouth 3 (three) times daily. 2 tabs twice a day and bedtime    . HEPATITIS A-HEP B RECOMB VAC IM Inject 1 mL into the muscle. 52m  IM  For 3 doses,ive 3 doses at 0,1,and 6 months    . Melatonin 10 MG TABS Take 1 tablet by mouth at bedtime as needed.    . Multiple Vitamin (MULTIVITAMIN WITH MINERALS) TABS tablet Take 1 tablet by mouth every morning.    .Marland KitchenNAPROXEN PO Take 220 mg by mouth as needed.     . predniSONE (DELTASONE) 20 MG tablet 3 by mouth for 2 days, then 2 by mouth for 2 days, then 1 by mouth for 2 days, then 1/2 by mouth for 2 days. (Patient not taking: Reported on 02/13/2016) 13 tablet 0  . tamsulosin (FLOMAX) 0.4 MG CAPS capsule Take 0.4 mg by mouth.    . vitamin C (ASCORBIC  ACID) 500 MG tablet Take 500 mg by mouth daily.     No current facility-administered medications for this encounter.      ALLERGIES: Patient has no known allergies.   LABORATORY DATA:  No results found for: WBC, HGB, HCT, MCV, PLT No results found for: NA, K, CL, CO2 No results found for: ALT, AST, GGT, ALKPHOS, BILITOT   NARRATIVE: Gregory RIERAwas seen today for weekly treatment management. The chart was checked and the patient's films were reviewed.  Gregory Chang completed 1 fractions to his right lung.  He reports having joint soreness and said he thinks it was from an infusion for high calcium yesterday.  He reports shortness of breath especially with activity.  He reports a frequent cough mostly at night.  He reports having hemoptysis about the size of a penny that happens at night or in the morning.  He reports having fatigue.    There were no vitals taken for this visit.   Wt Readings from Last 3 Encounters:  02/13/16 179 lb 9.6 oz (81.5 kg)  02/11/16 179 lb 12.8 oz (81.6 kg)  06/15/14 184 lb (83.5 kg)    PHYSICAL EXAMINATION: vitals were  not taken for this visit.     Alert, no acute distress  ASSESSMENT: The patient is doing satisfactorily with treatment.  PLAN: We will continue with the patient's radiation treatment as planned.

## 2016-02-15 ENCOUNTER — Ambulatory Visit
Admission: RE | Admit: 2016-02-15 | Discharge: 2016-02-15 | Disposition: A | Discharge status: Home / Self Care | Payer: Non-veteran care | Source: Ambulatory Visit | Attending: Radiation Oncology | Admitting: Radiation Oncology

## 2016-02-15 ENCOUNTER — Telehealth: Payer: Self-pay | Admitting: *Deleted

## 2016-02-15 DIAGNOSIS — Z510 Encounter for antineoplastic radiation therapy: Secondary | ICD-10-CM | POA: Diagnosis not present

## 2016-02-15 NOTE — Telephone Encounter (Signed)
On 02-15-16 [facility] consult note.

## 2016-02-18 ENCOUNTER — Ambulatory Visit
Admission: RE | Admit: 2016-02-18 | Discharge: 2016-02-18 | Disposition: A | Discharge status: Home / Self Care | Payer: Non-veteran care | Source: Ambulatory Visit | Attending: Radiation Oncology | Admitting: Radiation Oncology

## 2016-02-18 ENCOUNTER — Encounter: Payer: Self-pay | Admitting: Radiation Oncology

## 2016-02-18 ENCOUNTER — Ambulatory Visit (HOSPITAL_COMMUNITY)
Admission: RE | Admit: 2016-02-18 | Discharge: 2016-02-18 | Disposition: A | Discharge status: Home / Self Care | Payer: Non-veteran care | Source: Ambulatory Visit | Attending: Radiation Oncology | Admitting: Radiation Oncology

## 2016-02-18 VITALS — BP 107/54 | HR 93 | Temp 98.5°F | Resp 22 | Wt 176.0 lb

## 2016-02-18 DIAGNOSIS — C3481 Malignant neoplasm of overlapping sites of right bronchus and lung: Secondary | ICD-10-CM

## 2016-02-18 DIAGNOSIS — R0602 Shortness of breath: Secondary | ICD-10-CM | POA: Diagnosis present

## 2016-02-18 DIAGNOSIS — R938 Abnormal findings on diagnostic imaging of other specified body structures: Secondary | ICD-10-CM | POA: Diagnosis not present

## 2016-02-18 DIAGNOSIS — Z510 Encounter for antineoplastic radiation therapy: Secondary | ICD-10-CM | POA: Diagnosis not present

## 2016-02-18 NOTE — Progress Notes (Signed)
Weekly rad txs right lung 4/10 completed, congestive cough, wheezing, sob, listened to lungs, right lower lung on back dim with crackles, rest clear, patient very sob when just ambulating to scale and back to room 6,  50 feet approximately,  Coughs up clear phlegm, difficulty swallowing, poor appetite 1:04 PM BP (!) 107/54 (BP Location: Right Arm, Patient Position: Sitting, Cuff Size: Normal)   Pulse 93   Temp 98.5 F (36.9 C) (Oral)   Resp (!) 22   Wt 176 lb (79.8 kg)   SpO2 95% Comment: room air  BMI 24.55 kg/m   Wt Readings from Last 3 Encounters:  02/18/16 176 lb (79.8 kg)  02/13/16 179 lb 9.6 oz (81.5 kg)  02/11/16 179 lb 12.8 oz (81.6 kg)

## 2016-02-18 NOTE — Progress Notes (Addendum)
Department of Radiation Oncology  Phone:  [phone] Fax:        [phone]  Weekly Treatment Note    Name: Gregory Chang Date: 02/18/2016 MRN: [id number] DOB: 03-Apr-1956   Diagnosis:     ICD-9-CM ICD-10-CM   1. Shortness of breath 786.05 R06.02 DG Chest 2 View  2. 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 albuterol (PROVENTIL HFA;VENTOLIN HFA) 108 (90 Base) MCG/ACT inhaler Inhale into the lungs every 6 (six) hours as needed for wheezing or shortness of breath.    Marland Kitchen amLODipine (NORVASC) 5 MG tablet Take 5 mg by mouth daily.    Marland Kitchen amoxicillin-clavulanate (AUGMENTIN) 875-125 MG tablet Take 1 tablet by mouth 2 (two) times daily.    Marland Kitchen aspirin EC 81 MG tablet Take 81 mg by mouth every morning.    . codeine 30 MG tablet Take 30 mg by mouth every 6 (six) hours as needed. Take 1 tab q 6 hours prn, take only if having cough symptoms and coughing up blood    . GABAPENTIN, ONCE-DAILY, PO Take 2 capsules by mouth 3 (three) times daily. 2 tabs twice a day and bedtime    . HEPATITIS A-HEP B RECOMB VAC IM Inject 1 mL into the muscle. 45m  IM  For 3 doses,ive 3 doses at 0,1,and 6 months    . Melatonin 10 MG TABS Take 1 tablet by mouth at bedtime as needed.    . Multiple Vitamin (MULTIVITAMIN WITH MINERALS) TABS tablet Take 1 tablet by mouth every morning.    .Marland KitchenNAPROXEN PO Take 220 mg by mouth as needed.     . tamsulosin (FLOMAX) 0.4 MG CAPS capsule Take 0.4 mg by mouth.    . vitamin C (ASCORBIC ACID) 500 MG tablet Take 500 mg by mouth daily.    . cyclobenzaprine (FLEXERIL) 10 MG tablet Take 1 tablet (10 mg total) by mouth 2 (two) times daily as needed for muscle spasms. (Patient not taking: Reported on 02/18/2016) 30 tablet 0  . predniSONE (DELTASONE) 20 MG tablet 3 by mouth for 2 days, then 2 by mouth for 2 days, then 1 by  mouth for 2 days, then 1/2 by mouth for 2 days. (Patient not taking: Reported on 02/18/2016) 13 tablet 0   No current facility-administered medications for this encounter.      ALLERGIES: Patient has no known allergies.   LABORATORY DATA:  No results found for: WBC, HGB, HCT, MCV, PLT No results found for: NA, K, CL, CO2 No results found for: ALT, AST, GGT, ALKPHOS, BILITOT   NARRATIVE: DSTUART MIRABILEwas seen today for weekly treatment management. The chart was checked and the patient's films were reviewed.  DVirgil Lightnerhas completed 4 fractions to his right lung.  He reports congestive cough with clear phlegm, wheezing, SOB, difficulty swallowing, and a poor appetite. Upon nurse's examination, right lower lung on back has dim breath sounds with crackles, the rest of the lungs were clear.   BP (!) 107/54 (BP Location: Right Arm, Patient Position: Sitting, Cuff Size: Normal)   Pulse 93   Temp 98.5 F (36.9 C) (Oral)   Resp (!) 22   Wt 176 lb (79.8 kg)   SpO2 95% Comment: room air  BMI 24.55 kg/m    Wt Readings from Last 3 Encounters:  02/18/16  176 lb (79.8 kg)  02/13/16 179 lb 9.6 oz (81.5 kg)  02/11/16 179 lb 12.8 oz (81.6 kg)    PHYSICAL EXAMINATION: weight is 176 lb (79.8 kg). His oral temperature is 98.5 F (36.9 C). His blood pressure is 107/54 (abnormal) and his pulse is 93. His respiration is 22 (abnormal) and oxygen saturation is 95%.      Alert, no acute distress. Slightly decreased breath sounds in the right lower lung, but otherwise clear throughout.  ASSESSMENT: The patient is doing satisfactorily with treatment. Patient's breathing has acutely worsened. He states considering going to the emergency room for this problem. He is comfortable sitting in the clinic today. There is increased density in right lower lung on his non diagnostic CT scan on treatment machine.   PLAN: We will continue with the patient's radiation treatment as planned. We will obtain a  chest x-ray for further information.   Addendum The patient's chest x-ray showed some consolidation within the right lower lobe consistent with pneumonia. We have called and antibiotics for him for a 10 day course and we will repeat a chest x-ray in several weeks.      This document serves as a record of services personally performed by Kyung Rudd, MD. It was created on his behalf by Bethann Humble, a trained medical scribe. The creation of this record is based on the scribe's personal observations and the provider's statements to them. This document has been checked and approved by the attending provider.

## 2016-02-18 NOTE — Addendum Note (Signed)
Encounter addended by: Kyung Rudd, MD on: 02/18/2016  5:36 PM<BR>    Actions taken: Sign clinical note

## 2016-02-19 ENCOUNTER — Ambulatory Visit
Admission: RE | Admit: 2016-02-19 | Discharge: 2016-02-19 | Disposition: A | Discharge status: Home / Self Care | Payer: Non-veteran care | Source: Ambulatory Visit | Attending: Radiation Oncology | Admitting: Radiation Oncology

## 2016-02-19 DIAGNOSIS — Z510 Encounter for antineoplastic radiation therapy: Secondary | ICD-10-CM | POA: Diagnosis not present

## 2016-02-20 ENCOUNTER — Ambulatory Visit
Admission: RE | Admit: 2016-02-20 | Discharge: 2016-02-20 | Disposition: A | Discharge status: Home / Self Care | Payer: Non-veteran care | Source: Ambulatory Visit | Attending: Radiation Oncology | Admitting: Radiation Oncology

## 2016-02-20 DIAGNOSIS — Z510 Encounter for antineoplastic radiation therapy: Secondary | ICD-10-CM | POA: Diagnosis not present

## 2016-02-21 ENCOUNTER — Ambulatory Visit
Admission: RE | Admit: 2016-02-21 | Discharge: 2016-02-21 | Disposition: A | Discharge status: Home / Self Care | Payer: Non-veteran care | Source: Ambulatory Visit | Attending: Radiation Oncology | Admitting: Radiation Oncology

## 2016-02-21 DIAGNOSIS — Z510 Encounter for antineoplastic radiation therapy: Secondary | ICD-10-CM | POA: Diagnosis not present

## 2016-02-22 ENCOUNTER — Encounter: Payer: Self-pay | Admitting: Radiation Oncology

## 2016-02-22 ENCOUNTER — Ambulatory Visit
Admission: RE | Admit: 2016-02-22 | Discharge: 2016-02-22 | Disposition: A | Discharge status: Home / Self Care | Payer: Non-veteran care | Source: Ambulatory Visit | Attending: Radiation Oncology | Admitting: Radiation Oncology

## 2016-02-22 VITALS — BP 98/57 | HR 86 | Temp 97.9°F | Ht 71.0 in | Wt 176.6 lb

## 2016-02-22 DIAGNOSIS — Z510 Encounter for antineoplastic radiation therapy: Secondary | ICD-10-CM | POA: Diagnosis not present

## 2016-02-22 DIAGNOSIS — C3481 Malignant neoplasm of overlapping sites of right bronchus and lung: Secondary | ICD-10-CM

## 2016-02-22 NOTE — Progress Notes (Addendum)
Danh has completed 8 fractions to his right lung.  He denies having pain.  He reports he is feeling much better today.  He is taking Augmentin and should be finished on Monday.  He reports his shortness of breath has improved and is now able to walk.  He reports having a productive cough and mentioned seeing a small amount of blood in his sputum on Wednesday and this morning.  He is taking codeine to help with the cough.  He reports having a sore throat which he said started before radiation.  He reports having a good appetite.  He does report having fatigue and said he does not sleep at night and naps throughout the day.  He said he is taking melatonin which is not helping.  He also mentioned that he is having hot and cold flashes during the night.  He denies having any skin irritation.  He has been given a one month follow up appointment.  BP (!) 98/57 (BP Location: Left Arm, Patient Position: Sitting)   Pulse 86   Temp 97.9 F (36.6 C) (Oral)   Ht '5\' 11"'$  (1.803 m)   Wt 176 lb 9.6 oz (80.1 kg)   SpO2 100%   BMI 24.63 kg/m    Wt Readings from Last 3 Encounters:  02/22/16 176 lb 9.6 oz (80.1 kg)  02/18/16 176 lb (79.8 kg)  02/13/16 179 lb 9.6 oz (81.5 kg)

## 2016-02-24 NOTE — Progress Notes (Signed)
  Radiation Oncology         [phone] ________________________________  Name: Gregory Chang MRN: [id number]  Date: 02/11/2016  DOB: October 30, 1955  SIMULATION AND TREATMENT PLANNING NOTE  DIAGNOSIS:     ICD-9-CM ICD-10-CM   1. 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  Identity was confirmed.  All relevant records and images related to the planned course of therapy were reviewed.   Written consent to proceed with treatment was confirmed which was freely given after reviewing the details related to the planned course of therapy had been reviewed with the patient.  Then, the patient was set-up in a stable reproducible  supine position for radiation therapy.  CT images were obtained.  Surface markings were placed.    Medically necessary complex treatment device(s) for immobilization:  N/A.   The CT images were loaded into the planning software.  Then the target and avoidance structures were contoured.  Treatment planning then occurred.  The radiation prescription was entered and confirmed.  A total of 5 complex treatment devices were fabricated which relate to the designed radiation treatment fields including to reduce fields to improve the dose homogeneity of the plan. Each of these customized fields/ complex treatment devices will be used on a daily basis during the radiation course. I have requested : 3D Simulation  I have requested a DVH of the following structures: Target volume, lungs, spinal cord.  The patient will undergo daily image guidance to ensure accurate localization of the target, and adequate minimize dose to the normal surrounding structures in close proximity to the target.    PLAN:  The patient will receive 30 Gy in 10 fractions.  ________________________________   Jodelle Gross, MD, PhD

## 2016-02-24 NOTE — Progress Notes (Signed)
Department of Radiation Oncology  Phone:  [phone] Fax:        [phone]  Weekly Treatment Note    Name: Gregory Chang Date: 02/24/2016 MRN: [id number] DOB: [birth year]   Diagnosis:     ICD-9-CM ICD-10-CM   1. 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 albuterol (PROVENTIL HFA;VENTOLIN HFA) 108 (90 Base) MCG/ACT inhaler Inhale into the lungs every 6 (six) hours as needed for wheezing or shortness of breath.    Marland Kitchen amLODipine (NORVASC) 5 MG tablet Take 5 mg by mouth daily.    Marland Kitchen amoxicillin-clavulanate (AUGMENTIN) 875-125 MG tablet Take 1 tablet by mouth 2 (two) times daily.    Marland Kitchen aspirin EC 81 MG tablet Take 81 mg by mouth every morning.    . codeine 30 MG tablet Take 30 mg by mouth every 6 (six) hours as needed. Take 1 tab q 6 hours prn, take only if having cough symptoms and coughing up blood    . GABAPENTIN, ONCE-DAILY, PO Take 2 capsules by mouth 3 (three) times daily. 2 tabs twice a day and bedtime    . HEPATITIS A-HEP B RECOMB VAC IM Inject 1 mL into the muscle. 61m  IM  For 3 doses,ive 3 doses at 0,1,and 6 months    . Melatonin 10 MG TABS Take 1 tablet by mouth at bedtime as needed.    . Multiple Vitamin (MULTIVITAMIN WITH MINERALS) TABS tablet Take 1 tablet by mouth every morning.    .Marland KitchenNAPROXEN PO Take 220 mg by mouth as needed.     . tamsulosin (FLOMAX) 0.4 MG CAPS capsule Take 0.4 mg by mouth.    . vitamin C (ASCORBIC ACID) 500 MG tablet Take 500 mg by mouth daily.    . cyclobenzaprine (FLEXERIL) 10 MG tablet Take 1 tablet (10 mg total) by mouth 2 (two) times daily as needed for muscle spasms. (Patient not taking: Reported on 02/22/2016) 30 tablet 0  . predniSONE (DELTASONE) 20 MG tablet 3 by mouth for 2 days, then 2 by mouth for 2 days, then 1 by mouth for 2 days, then 1/2 by mouth for 2 days.  (Patient not taking: Reported on 02/22/2016) 13 tablet 0   No current facility-administered medications for this encounter.      ALLERGIES: Patient has no known allergies.   LABORATORY DATA:  No results found for: WBC, HGB, HCT, MCV, PLT No results found for: NA, K, CL, CO2 No results found for: ALT, AST, GGT, ALKPHOS, BILITOT   NARRATIVE: Gregory PETRUCELLIwas seen today for weekly treatment management. The chart was checked and the patient's films were reviewed.  [name] completed 8 fractions to his right lung.  He denies having pain.  He reports he is feeling much better today.  He is taking Augmentin and should be finished on Monday.  He reports his shortness of breath has improved and is now able to walk.  He reports having a productive cough and mentioned seeing a small amount of blood in his sputum on Wednesday and this morning.  He is taking codeine to help with the cough.  He reports having a sore throat which he said started before radiation.  He reports having a good appetite.  He does report having fatigue and said he does not sleep at night and naps throughout  the day.  He said he is taking melatonin which is not helping.  He also mentioned that he is having hot and cold flashes during the night.  He denies having any skin irritation.  He has been given a one month follow up appointment.  The patient is doing well overall in terms of his treatment. His breathing is much better. The patient states he has a CT scan coming up at the end of the month through the [facility].  BP (!) 98/57 (BP Location: Left Arm, Patient Position: Sitting)   Pulse 86   Temp 97.9 F (36.6 C) (Oral)   Ht '5\' 11"'$  (1.803 m)   Wt 176 lb 9.6 oz (80.1 kg)   SpO2 100%   BMI 24.63 kg/m    Wt Readings from Last 3 Encounters:  02/22/16 176 lb 9.6 oz (80.1 kg)  02/18/16 176 lb (79.8 kg)  02/13/16 179 lb 9.6 oz (81.5 kg)    PHYSICAL EXAMINATION: height is '5\' 11"'$  (1.803 m) and weight is 176  lb 9.6 oz (80.1 kg). His oral temperature is 97.9 F (36.6 C). His blood pressure is 98/57 (abnormal) and his pulse is 86. His oxygen saturation is 100%.        ASSESSMENT: The patient is doing satisfactorily with treatment.  PLAN: We will continue with the patient's radiation treatment as planned. The patient will finish his course of radiation treatment next week. Because of his upcoming CT scan through the [place], I will not order a repeat chest x-ray to follow-up on his treatment for pneumonia. This should suffice and the patient's physicians there are aware of his respiratory difficulties recently.

## 2016-02-25 ENCOUNTER — Ambulatory Visit
Admission: RE | Admit: 2016-02-25 | Discharge: 2016-02-25 | Disposition: A | Discharge status: Home / Self Care | Payer: Non-veteran care | Source: Ambulatory Visit | Attending: Radiation Oncology | Admitting: Radiation Oncology

## 2016-02-25 DIAGNOSIS — Z510 Encounter for antineoplastic radiation therapy: Secondary | ICD-10-CM | POA: Diagnosis not present

## 2016-02-26 ENCOUNTER — Ambulatory Visit
Admission: RE | Admit: 2016-02-26 | Discharge: 2016-02-26 | Disposition: A | Discharge status: Home / Self Care | Payer: Non-veteran care | Source: Ambulatory Visit | Attending: Radiation Oncology | Admitting: Radiation Oncology

## 2016-02-26 DIAGNOSIS — Z510 Encounter for antineoplastic radiation therapy: Secondary | ICD-10-CM | POA: Diagnosis not present

## 2016-03-03 ENCOUNTER — Telehealth: Payer: Self-pay | Admitting: *Deleted

## 2016-03-03 ENCOUNTER — Other Ambulatory Visit: Payer: Self-pay | Admitting: Radiation Oncology

## 2016-03-03 MED ORDER — SUCRALFATE 1 G PO TABS: 1.0000 g | ORAL_TABLET | Freq: Four times a day (QID) | ORAL | 2 refills | Status: AC

## 2016-03-03 NOTE — Telephone Encounter (Signed)
Patient called c/o difficulty swallowing anything, "It took 20 minutes to get carnation instant breakfast down his throat,,it went down by dravity",  Cannot eat any foods, completed radiatio treatment 02/27/16, will discuss with Dr. Lisbeth Renshaw , may need carafate rx, he stated he would come pick up and take to the [place], ,will call patient back with status 9:26 AM .

## 2016-03-10 ENCOUNTER — Encounter: Payer: Self-pay | Admitting: Radiation Oncology

## 2016-03-10 NOTE — Progress Notes (Signed)
°  Radiation Oncology         [phone] ________________________________  Name: Gregory Chang MRN: [id number]  Date: 03/10/2016  DOB: 1955-12-12  End of Treatment Note  Diagnosis:   Malignant neoplasm of overlapping 162.8 sites of right lung [facility])   Indication for treatment:  Curative       Radiation treatment dates:  02/13/16-02/26/16  Site/dose:   Right lung / 30 Gy in 10 fx  Beams/energy:  3D / 10X 6X  Narrative: The patient tolerated radiation treatment relatively well.   During treatment, the patient reported a productive cough and loss of sleep. He did report improved shortness of breath.  Plan: The patient has completed radiation treatment. The patient will return to radiation oncology clinic for routine followup in one month. I advised them to call or return sooner if they have any questions or concerns related to their recovery or treatment.  ------------------------------------------------  Jodelle Gross, MD, PhD  This document serves as a record of services personally performed by Kyung Rudd, MD. It was created on his behalf by Bethann Humble, a trained medical scribe. The creation of this record is based on the scribe's personal observations and the provider's statements to them. This document has been checked and approved by the attending provider.

## 2016-03-24 ENCOUNTER — Emergency Department (HOSPITAL_COMMUNITY): Payer: Non-veteran care

## 2016-03-24 ENCOUNTER — Emergency Department (HOSPITAL_COMMUNITY)
Admission: EM | Admit: 2016-03-24 | Discharge: 2016-03-25 | Disposition: A | Discharge status: Home / Self Care | Payer: Non-veteran care | Attending: Emergency Medicine | Admitting: Emergency Medicine

## 2016-03-24 ENCOUNTER — Encounter (HOSPITAL_COMMUNITY): Payer: Self-pay | Admitting: Family Medicine

## 2016-03-24 DIAGNOSIS — Z7982 Long term (current) use of aspirin: Secondary | ICD-10-CM | POA: Diagnosis not present

## 2016-03-24 DIAGNOSIS — R509 Fever, unspecified: Secondary | ICD-10-CM | POA: Diagnosis present

## 2016-03-24 DIAGNOSIS — R791 Abnormal coagulation profile: Secondary | ICD-10-CM | POA: Diagnosis not present

## 2016-03-24 DIAGNOSIS — I10 Essential (primary) hypertension: Secondary | ICD-10-CM | POA: Insufficient documentation

## 2016-03-24 DIAGNOSIS — Z87891 Personal history of nicotine dependence: Secondary | ICD-10-CM | POA: Insufficient documentation

## 2016-03-24 DIAGNOSIS — Z85118 Personal history of other malignant neoplasm of bronchus and lung: Secondary | ICD-10-CM | POA: Insufficient documentation

## 2016-03-24 DIAGNOSIS — J069 Acute upper respiratory infection, unspecified: Secondary | ICD-10-CM | POA: Insufficient documentation

## 2016-03-24 HISTORY — DX: Polyneuropathy, unspecified: G62.9

## 2016-03-24 LAB — COMPREHENSIVE METABOLIC PANEL
ALT: 75 U/L — ABNORMAL HIGH (ref 17–63)
ANION GAP: 9 (ref 5–15)
AST: 57 U/L — AB (ref 15–41)
Albumin: 3.4 g/dL — ABNORMAL LOW (ref 3.5–5.0)
Alkaline Phosphatase: 68 U/L (ref 38–126)
BILIRUBIN TOTAL: 0.7 mg/dL (ref 0.3–1.2)
BUN: 15 mg/dL (ref 6–20)
CHLORIDE: 95 mmol/L — AB (ref 101–111)
CO2: 24 mmol/L (ref 22–32)
Calcium: 8.5 mg/dL — ABNORMAL LOW (ref 8.9–10.3)
Creatinine, Ser: 0.71 mg/dL (ref 0.61–1.24)
GFR calc Af Amer: >60 mL/min (ref >60)
Glucose, Bld: 103 mg/dL — ABNORMAL HIGH (ref 65–99)
POTASSIUM: 3.5 mmol/L (ref 3.5–5.1)
Sodium: 128 mmol/L — ABNORMAL LOW (ref 135–145)
TOTAL PROTEIN: 7.5 g/dL (ref 6.5–8.1)

## 2016-03-24 LAB — URINALYSIS, ROUTINE W REFLEX MICROSCOPIC
Bilirubin Urine: NEGATIVE
Glucose, UA: NEGATIVE mg/dL
HGB URINE DIPSTICK: NEGATIVE
Ketones, ur: NEGATIVE mg/dL
LEUKOCYTES UA: NEGATIVE
NITRITE: NEGATIVE
PROTEIN: NEGATIVE mg/dL
Specific Gravity, Urine: 1.008 (ref 1.005–1.030)
pH: 7 (ref 5.0–8.0)

## 2016-03-24 LAB — CBC WITH DIFFERENTIAL/PLATELET
BASOS ABS: 0 10*3/uL (ref 0.0–0.1)
BASOS PCT: 0 %
Eosinophils Absolute: 0.1 10*3/uL (ref 0.0–0.7)
Eosinophils Relative: 2 %
HEMATOCRIT: 28.6 % — AB (ref 39.0–52.0)
HEMOGLOBIN: 10.1 g/dL — AB (ref 13.0–17.0)
LYMPHS PCT: 13 %
Lymphs Abs: 0.7 10*3/uL (ref 0.7–4.0)
MCH: 28.3 pg (ref 26.0–34.0)
MCHC: 35.3 g/dL (ref 30.0–36.0)
MCV: 80.1 fL (ref 78.0–100.0)
Monocytes Absolute: 0.7 10*3/uL (ref 0.1–1.0)
Monocytes Relative: 14 %
NEUTROS ABS: 3.5 10*3/uL (ref 1.7–7.7)
NEUTROS PCT: 71 %
Platelets: 259 10*3/uL (ref 150–400)
RBC: 3.57 MIL/uL — AB (ref 4.22–5.81)
RDW: 12.5 % (ref 11.5–15.5)
WBC: 5 10*3/uL (ref 4.0–10.5)

## 2016-03-24 LAB — I-STAT CG4 LACTIC ACID, ED: LACTIC ACID, VENOUS: 0.99 mmol/L (ref 0.5–1.9)

## 2016-03-24 LAB — PROTIME-INR
INR: 1.08
Prothrombin Time: 14.1 seconds (ref 11.4–15.2)

## 2016-03-24 MED ORDER — DOXYCYCLINE HYCLATE 100 MG PO CAPS: 100.0000 mg | ORAL_CAPSULE | Freq: Two times a day (BID) | ORAL | 0 refills | Status: DC

## 2016-03-24 MED ORDER — DEXTROSE 5 % IV SOLN
2.0000 g | Freq: Once | INTRAVENOUS | Status: AC
  Administered 2016-03-24: 2 g via INTRAVENOUS
  Filled 2016-03-24: qty 2

## 2016-03-24 MED ORDER — SODIUM CHLORIDE 0.9 % IV BOLUS (SEPSIS)
1000.0000 mL | Freq: Once | INTRAVENOUS | Status: AC
  Administered 2016-03-24: 1000 mL via INTRAVENOUS

## 2016-03-24 NOTE — Discharge Instructions (Signed)
Your sodium was low today (128).  Please follow up with your Oncologist for recheck.

## 2016-03-24 NOTE — ED Provider Notes (Signed)
[facility] DEPT Provider Note   CSN: [id number] Arrival date & time: 03/24/16  2010     History   Chief Complaint Chief Complaint  Patient presents with  . Fever    HPI Gregory Chang is a 60 y.o. male.  HPI  Gregory Chang is a 60 y.o. male who presents to the Emergency Department complaining of fever.  He has a hx/o lung cancer, currently undergoing chemo at [facility].  Today he develop fever to 100.7 with associated chills.  He endorses mild associated cough that he describes a feeling like he is coming down with a cold.  He has mild associated sore throat.  No abdominal pain, vomiting, chest pain, sob, dysuria.  Sxs are mild to moderate, improving.    Past Medical History:  Diagnosis Date  . Hypertension   . 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 Neuropathy [facility])     Patient Active Problem List   Diagnosis Date Noted  . Malignant neoplasm of overlapping sites of right lung ([place]) 02/11/2016  . Tobacco abuse 06/09/2014    Past Surgical History:  Procedure Laterality Date  . SPINE SURGERY         Home Medications    Prior to Admission medications   Medication Sig Start Date End Date Taking? Authorizing Provider  acetaminophen (TYLENOL) 500 MG tablet Take 1,000 mg by mouth every 6 (six) hours as needed for mild pain, moderate pain or fever.   Yes Historical Provider, MD  albuterol (PROVENTIL HFA;VENTOLIN HFA) 108 (90 Base) MCG/ACT inhaler Inhale into the lungs every 6 (six) hours as needed for wheezing or shortness of breath.   Yes Historical Provider, MD  amLODipine (NORVASC) 5 MG tablet Take 5 mg by mouth every morning.    Yes Historical Provider, MD  aspirin EC 81 MG tablet Take 81 mg by mouth every morning.   Yes Historical Provider, MD  gabapentin (NEURONTIN) 300 MG capsule Take 300 mg by mouth 2 (two) times daily as needed (NERVE PAIN).   Yes Historical Provider, MD  magic mouthwash SOLN Take 10 mLs by mouth 2  (two) times daily.   Yes Historical Provider, MD  Multiple Vitamin (MULTIVITAMIN WITH MINERALS) TABS tablet Take 1 tablet by mouth every morning.   Yes Historical Provider, MD  omeprazole (PRILOSEC) 20 MG capsule Take 20 mg by mouth 2 (two) times daily before a meal.   Yes Historical Provider, MD  ondansetron (ZOFRAN) 8 MG tablet Take 8 mg by mouth every 8 (eight) hours as needed for nausea or vomiting. TAKE 2 TABLETS X 2 DAYS FOLLOWING CHEMO   Yes Historical Provider, MD  sucralfate (CARAFATE) 1 g tablet Take 1 tablet (1 g total) by mouth 4 (four) times daily. 03/03/16  Yes Kyung Rudd, MD  tamsulosin (FLOMAX) 0.4 MG CAPS capsule Take 0.8 mg by mouth daily after breakfast.    Yes Historical Provider, MD  vitamin C (ASCORBIC ACID) 500 MG tablet Take 500 mg by mouth every morning.    Yes Historical Provider, MD  cyclobenzaprine (FLEXERIL) 10 MG tablet Take 1 tablet (10 mg total) by mouth 2 (two) times daily as needed for muscle spasms. Patient not taking: Reported on 03/24/2016 06/09/14   Darreld Mclean, MD  doxycycline (VIBRAMYCIN) 100 MG capsule Take 1 capsule (100 mg total) by mouth 2 (two) times daily. 03/24/16   Quintella Reichert, MD  predniSONE (DELTASONE) 20 MG tablet 3 by mouth for 2 days, then 2  by mouth for 2 days, then 1 by mouth for 2 days, then 1/2 by mouth for 2 days. Patient not taking: Reported on 03/24/2016 06/15/14   Wendie Agreste, MD    Family History Family History  Problem Relation Age of Onset  . Hypertension Brother     Social History Social History  Substance Use Topics  . Smoking status: Former Smoker    Packs/day: 1.00    Years: 45.00  . Smokeless tobacco: Never Used  . Alcohol use No     Comment: Quit drinking in July 2017     Allergies   Patient has no known allergies.   Review of Systems Review of Systems  All other systems reviewed and are negative.    Physical Exam Updated Vital Signs BP 134/83   Pulse 82   Temp 97.9 F (36.6 C) (Oral)    Resp 18   Ht '5\' 11"'$  (1.803 m)   Wt 178 lb 9 oz (81 kg)   SpO2 100%   BMI 24.90 kg/m   Physical Exam  Constitutional: He is oriented to person, place, and time. He appears well-developed and well-nourished.  HENT:  Head: Normocephalic and atraumatic.  Cardiovascular: Normal rate and regular rhythm.   No murmur heard. Pulmonary/Chest: Effort normal and breath sounds normal. No respiratory distress.  Abdominal: Soft. There is no tenderness. There is no rebound and no guarding.  Musculoskeletal: He exhibits no edema or tenderness.  Neurological: He is alert and oriented to person, place, and time.  Skin: Skin is warm and dry.  Psychiatric: He has a normal mood and affect. His behavior is normal.  Nursing note and vitals reviewed.    ED Treatments / Results  Labs (all labs ordered are listed, but only abnormal results are displayed) Labs Reviewed  COMPREHENSIVE METABOLIC PANEL - Abnormal; Notable for the following:       Result Value   Sodium 128 (*)    Chloride 95 (*)    Glucose, Bld 103 (*)    Calcium 8.5 (*)    Albumin 3.4 (*)    AST 57 (*)    ALT 75 (*)    All other components within normal limits  CBC WITH DIFFERENTIAL/PLATELET - Abnormal; Notable for the following:    RBC 3.57 (*)    Hemoglobin 10.1 (*)    HCT 28.6 (*)    All other components within normal limits  CULTURE, BLOOD (ROUTINE X 2)  CULTURE, BLOOD (ROUTINE X 2)  URINE CULTURE  PROTIME-INR  URINALYSIS, ROUTINE W REFLEX MICROSCOPIC  I-STAT CG4 LACTIC ACID, ED    EKG  EKG Interpretation None       Radiology Dg Chest 2 View  Result Date: 03/24/2016 CLINICAL DATA:  Fever today.  History of lung cancer. EXAM: CHEST  2 VIEW COMPARISON:  CT chest 01/03/2016.  PA and lateral chest 02/18/2016. FINDINGS: The lungs are emphysematous. Mediastinal lymphadenopathy has improved. Right pleural effusion and basilar airspace disease seen on the most recent study have resolved. Heart size is normal. No  pneumothorax. No focal bony abnormality. IMPRESSION: No acute disease. Marked improvement and mediastinal lymphadenopathy. Emphysema. Electronically Signed   By: Inge Rise M.D.   On: 03/24/2016 21:21    Procedures Procedures (including critical care time)  Medications Ordered in ED Medications  sodium chloride 0.9 % bolus 1,000 mL (0 mLs Intravenous Stopped 03/25/16 0004)  ceFEPIme (MAXIPIME) 2 g in dextrose 5 % 50 mL IVPB (0 g Intravenous Stopped 03/24/16 2314)  Initial Impression / Assessment and Plan / ED Course  I have reviewed the triage vital signs and the nursing notes.  Pertinent labs & imaging results that were available during my care of the patient were reviewed by me and considered in my medical decision making (see chart for details).  Clinical Course     Patient with history of lung cancer currently undergoing treatment with chemotherapy here with temperature to 100.7 at home and chills. He is nontoxic appearing in the emergency department with no respiratory distress. His lungs are clear on examination. Labs demonstrate mild hyponatremia. No prior labs available for comparison. Chest x-ray with no evidence of acute infiltrate in UA is negative for UTI. He was provided with IV fluid hydration and empiric antibiotics for potential developing infection. Will prescribe doxycycline for potential bronchitis with close oncology follow-up. Home care and return precautions discussed.  Final Clinical Impressions(s) / ED Diagnoses   Final diagnoses:  Fever, unspecified fever cause  Upper respiratory tract infection, unspecified type    New Prescriptions Discharge Medication List as of 03/24/2016 11:31 PM    START taking these medications   Details  doxycycline (VIBRAMYCIN) 100 MG capsule Take 1 capsule (100 mg total) by mouth 2 (two) times daily., Starting Mon 03/24/2016, Print         Quintella Reichert, MD 03/25/16 [phone]

## 2016-03-24 NOTE — ED Triage Notes (Signed)
Patient has stage 4 lung cancer and received chemotherapy today. Pt started experiencing a fever 100.7 at home around 18:00. Reports he has a productive cough.

## 2016-03-24 NOTE — ED Notes (Signed)
Pt endorses no other complaints other than fever. Pt reports fever of 100.4-101F at home. Pt on 2nd round of chemotherapy. Pt expresses concern as his father passed on his second round of chemotherapy. Temperature 97.18F orally at this time.

## 2016-03-26 LAB — URINE CULTURE

## 2016-03-29 LAB — CULTURE, BLOOD (ROUTINE X 2)
Culture: NO GROWTH
Culture: NO GROWTH

## 2016-04-04 ENCOUNTER — Encounter (HOSPITAL_COMMUNITY): Payer: Self-pay | Admitting: Emergency Medicine

## 2016-04-04 ENCOUNTER — Inpatient Hospital Stay (HOSPITAL_COMMUNITY)
Admission: EM | Admit: 2016-04-04 | Discharge: 2016-04-06 | DRG: 194 | Disposition: A | Discharge status: Home / Self Care | Payer: Non-veteran care | Attending: Family Medicine | Admitting: Family Medicine

## 2016-04-04 ENCOUNTER — Emergency Department (HOSPITAL_COMMUNITY): Payer: Non-veteran care

## 2016-04-04 DIAGNOSIS — Z79899 Other long term (current) drug therapy: Secondary | ICD-10-CM

## 2016-04-04 DIAGNOSIS — J189 Pneumonia, unspecified organism: Secondary | ICD-10-CM | POA: Diagnosis not present

## 2016-04-04 DIAGNOSIS — Z9889 Other specified postprocedural states: Secondary | ICD-10-CM

## 2016-04-04 DIAGNOSIS — C3481 Malignant neoplasm of overlapping sites of right bronchus and lung: Secondary | ICD-10-CM | POA: Diagnosis present

## 2016-04-04 DIAGNOSIS — C3490 Malignant neoplasm of unspecified part of unspecified bronchus or lung: Secondary | ICD-10-CM | POA: Diagnosis present

## 2016-04-04 DIAGNOSIS — Z7982 Long term (current) use of aspirin: Secondary | ICD-10-CM

## 2016-04-04 DIAGNOSIS — Z87891 Personal history of nicotine dependence: Secondary | ICD-10-CM

## 2016-04-04 DIAGNOSIS — I10 Essential (primary) hypertension: Secondary | ICD-10-CM | POA: Diagnosis present

## 2016-04-04 DIAGNOSIS — Z9221 Personal history of antineoplastic chemotherapy: Secondary | ICD-10-CM

## 2016-04-04 DIAGNOSIS — Z8249 Family history of ischemic heart disease and other diseases of the circulatory system: Secondary | ICD-10-CM

## 2016-04-04 DIAGNOSIS — Z923 Personal history of irradiation: Secondary | ICD-10-CM

## 2016-04-04 LAB — COMPREHENSIVE METABOLIC PANEL
ALBUMIN: 3.3 g/dL — AB (ref 3.5–5.0)
ALT: 35 U/L (ref 17–63)
ANION GAP: 9 (ref 5–15)
AST: 28 U/L (ref 15–41)
Alkaline Phosphatase: 59 U/L (ref 38–126)
BILIRUBIN TOTAL: 0.4 mg/dL (ref 0.3–1.2)
BUN: 15 mg/dL (ref 6–20)
CO2: 22 mmol/L (ref 22–32)
Calcium: 8.8 mg/dL — ABNORMAL LOW (ref 8.9–10.3)
Chloride: 96 mmol/L — ABNORMAL LOW (ref 101–111)
Creatinine, Ser: 0.78 mg/dL (ref 0.61–1.24)
Glucose, Bld: 183 mg/dL — ABNORMAL HIGH (ref 65–99)
POTASSIUM: 3.5 mmol/L (ref 3.5–5.1)
Sodium: 127 mmol/L — ABNORMAL LOW (ref 135–145)
TOTAL PROTEIN: 7.4 g/dL (ref 6.5–8.1)

## 2016-04-04 LAB — URINALYSIS, ROUTINE W REFLEX MICROSCOPIC
Bilirubin Urine: NEGATIVE
GLUCOSE, UA: NEGATIVE mg/dL
Hgb urine dipstick: NEGATIVE
Ketones, ur: NEGATIVE mg/dL
LEUKOCYTES UA: NEGATIVE
NITRITE: NEGATIVE
PH: 7 (ref 5.0–8.0)
Protein, ur: NEGATIVE mg/dL
Specific Gravity, Urine: 1.01 (ref 1.005–1.030)

## 2016-04-04 LAB — CBC WITH DIFFERENTIAL/PLATELET
BASOS ABS: 0 10*3/uL (ref 0.0–0.1)
BASOS PCT: 0 %
Eosinophils Absolute: 0.1 10*3/uL (ref 0.0–0.7)
Eosinophils Relative: 1 %
HEMATOCRIT: 25.5 % — AB (ref 39.0–52.0)
HEMOGLOBIN: 8.9 g/dL — AB (ref 13.0–17.0)
LYMPHS PCT: 7 %
Lymphs Abs: 0.8 10*3/uL (ref 0.7–4.0)
MCH: 28.6 pg (ref 26.0–34.0)
MCHC: 34.9 g/dL (ref 30.0–36.0)
MCV: 82 fL (ref 78.0–100.0)
Monocytes Absolute: 3.1 10*3/uL — ABNORMAL HIGH (ref 0.1–1.0)
Monocytes Relative: 28 %
NEUTROS ABS: 7.2 10*3/uL (ref 1.7–7.7)
NEUTROS PCT: 64 %
Platelets: 480 10*3/uL — ABNORMAL HIGH (ref 150–400)
RBC: 3.11 MIL/uL — AB (ref 4.22–5.81)
RDW: 14.5 % (ref 11.5–15.5)
WBC: 11.2 10*3/uL — AB (ref 4.0–10.5)

## 2016-04-04 LAB — I-STAT CG4 LACTIC ACID, ED: Lactic Acid, Venous: 1.88 mmol/L (ref 0.5–1.9)

## 2016-04-04 MED ORDER — PIPERACILLIN-TAZOBACTAM 3.375 G IVPB 30 MIN
3.3750 g | Freq: Once | INTRAVENOUS | Status: AC
Start: 2016-04-04 — End: 2016-04-04
  Administered 2016-04-04: 3.375 g via INTRAVENOUS
  Filled 2016-04-04: qty 50

## 2016-04-04 MED ORDER — VANCOMYCIN HCL IN DEXTROSE 1-5 GM/200ML-% IV SOLN
1000.0000 mg | Freq: Once | INTRAVENOUS | Status: AC
  Administered 2016-04-04: 1000 mg via INTRAVENOUS
  Filled 2016-04-04: qty 200

## 2016-04-04 MED ORDER — PIPERACILLIN-TAZOBACTAM 3.375 G IVPB
3.3750 g | Freq: Three times a day (TID) | INTRAVENOUS | Status: DC
  Administered 2016-04-05 – 2016-04-06 (×4): 3.375 g via INTRAVENOUS
  Filled 2016-04-04 (×6): qty 50

## 2016-04-04 MED ORDER — VANCOMYCIN HCL IN DEXTROSE 1-5 GM/200ML-% IV SOLN
1000.0000 mg | Freq: Three times a day (TID) | INTRAVENOUS | Status: DC
  Administered 2016-04-05 – 2016-04-06 (×4): 1000 mg via INTRAVENOUS
  Filled 2016-04-04 (×5): qty 200

## 2016-04-04 NOTE — Progress Notes (Signed)
Pharmacy Antibiotic Note  Gregory Chang is a 60 y.o. male admitted on 04/04/2016 with sepsis.  Significant PMH includes lung cancer, undergoing chemotherapy (last chemo noted as 03/24/16).  Presents with worsening cough, and fever.  In the ED, patient received Vancomycin 1gm and Zosyn 3.375gm IV x 1 dose each.  Pharmacy has been consulted for Vancomycin and Zosyn dosing.  Plan:  Zosyn 3.375gm IV q8h (each dose infused over 4 hrs)  Vancomycin 1gm IV q8h   Vancomycin trough goal: 15-20 mcg/ml  F/U cultures, renal function, clinical course  Height: '5\' 10"'$  (177.8 cm) Weight: 187 lb (84.8 kg) IBW/kg (Calculated) : 73  Temp (24hrs), Avg:99.8 F (37.7 C), Min:98.3 F (36.8 C), Max:101.2 F (38.4 C)   Recent Labs Lab 04/04/16 2156 04/04/16 2215  WBC 11.2*  --   CREATININE 0.78  --   LATICACIDVEN  --  1.88    Estimated Creatinine Clearance: 98.9 mL/min (by C-G formula based on SCr of 0.78 mg/dL).    No Known Allergies  Antimicrobials this admission: 12/29 vanc >>   12/29 zosyn >>    Dose adjustments this admission:    Microbiology results: 12/29 BCx: sent 12/29 UCx: sent   Thank you for allowing pharmacy to be a part of this patient's care.  Everette Rank, PharmD 04/04/2016 11:42 PM

## 2016-04-04 NOTE — ED Notes (Signed)
Pt in radiology 

## 2016-04-04 NOTE — Progress Notes (Addendum)
Mr. Gregory Chang. 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   Weight changes, if any: Respiratory complaints, if any:  Hemoptysis, if any:   Swallowing Problems/Pain/Difficulty swallowing: Smoking Tobacco/Marijuana/Snuff/ETOH use: Smoker smoker x 45 years marijuana no alcohol usuage Appetite : Pain:  When is next chemo scheduled?: Lab work from of chart: 03-24-16 CBC w diff, Cmet, PT INR

## 2016-04-04 NOTE — ED Notes (Signed)
Patient states he took TYLENOL 2 tablets at 19:30.

## 2016-04-04 NOTE — ED Triage Notes (Signed)
Pt states that he has fever and worsening cough x 4 days.  Temp of 102. at home.  Stage 4 lung cancer.  Last chemo was 12/18.  A&O x 4.

## 2016-04-04 NOTE — ED Provider Notes (Signed)
[facility] DEPT Provider Note   CSN: [id number] Arrival date & time: 04/04/16  2113     History   Chief Complaint Chief Complaint  Patient presents with  . Code Sepsis    HPI Gregory Chang is a 60 y.o. male.Patient has had fever every day for the past 3 weeks. He reports slight cough, denies shortness of breath. He's had cough for several weeks.Which is essentially unchanged. He denies shortness of breath denies nausea or vomiting denies abdominal pain denies urinary symptoms. His temperature was 103 at 6:30 PM tonight. He treated himself with Tylenol at 7 PM. No other associated symptoms. Nothing makes symptoms better or worse. He does not feel ill at present. Last chemotherapy 03/24/2016. He was seen here on 12 09/23/2015 for fever. Treated with doxycycline empirically which he has completed  HPI  Past Medical History:  Diagnosis Date  . Hypertension   . 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 Neuropathy [facility])     Patient Active Problem List   Diagnosis Date Noted  . Malignant neoplasm of overlapping sites of right lung ([place]) 02/11/2016  . Tobacco abuse 06/09/2014    Past Surgical History:  Procedure Laterality Date  . SPINE SURGERY         Home Medications    Prior to Admission medications   Medication Sig Start Date End Date Taking? Authorizing Provider  acetaminophen (TYLENOL) 500 MG tablet Take 1,000 mg by mouth every 6 (six) hours as needed for mild pain, moderate pain or fever.   Yes Historical Provider, MD  amLODipine (NORVASC) 5 MG tablet Take 5 mg by mouth every morning.    Yes Historical Provider, MD  aspirin EC 81 MG tablet Take 81 mg by mouth every morning.   Yes Historical Provider, MD  magic mouthwash SOLN Take 10 mLs by mouth 2 (two) times daily.   Yes Historical Provider, MD  Multiple Vitamin (MULTIVITAMIN WITH MINERALS) TABS tablet Take 1 tablet by mouth every morning.   Yes Historical Provider, MD    omeprazole (PRILOSEC) 20 MG capsule Take 20 mg by mouth 2 (two) times daily before a meal.   Yes Historical Provider, MD  sucralfate (CARAFATE) 1 g tablet Take 1 tablet (1 g total) by mouth 4 (four) times daily. 03/03/16  Yes Kyung Rudd, MD  tamsulosin (FLOMAX) 0.4 MG CAPS capsule Take 0.8 mg by mouth daily after breakfast.    Yes Historical Provider, MD  vitamin C (ASCORBIC ACID) 500 MG tablet Take 500 mg by mouth every morning.    Yes Historical Provider, MD  albuterol (PROVENTIL HFA;VENTOLIN HFA) 108 (90 Base) MCG/ACT inhaler Inhale into the lungs every 6 (six) hours as needed for wheezing or shortness of breath.    Historical Provider, MD  cyclobenzaprine (FLEXERIL) 10 MG tablet Take 1 tablet (10 mg total) by mouth 2 (two) times daily as needed for muscle spasms. Patient not taking: Reported on 04/04/2016 06/09/14   Darreld Mclean, MD  doxycycline (VIBRAMYCIN) 100 MG capsule Take 1 capsule (100 mg total) by mouth 2 (two) times daily. Patient not taking: Reported on 04/04/2016 03/24/16   Quintella Reichert, MD  gabapentin (NEURONTIN) 300 MG capsule Take 300 mg by mouth 2 (two) times daily as needed (NERVE PAIN).    Historical Provider, MD  ondansetron (ZOFRAN) 8 MG tablet Take 8 mg by mouth every 8 (eight) hours as needed for nausea or vomiting. TAKE 2 TABLETS X 2 DAYS FOLLOWING CHEMO  Historical Provider, MD  predniSONE (DELTASONE) 20 MG tablet 3 by mouth for 2 days, then 2 by mouth for 2 days, then 1 by mouth for 2 days, then 1/2 by mouth for 2 days. Patient not taking: Reported on 04/04/2016 06/15/14   Wendie Agreste, MD    Family History Family History  Problem Relation Age of Onset  . Hypertension Brother     Social History Social History  Substance Use Topics  . Smoking status: Former Smoker    Packs/day: 1.00    Years: 45.00  . Smokeless tobacco: Never Used  . Alcohol use No     Comment: Quit drinking in July 2017   Positive marijuana use. No IV drug use  Allergies    Patient has no known allergies.   Review of Systems Review of Systems  Constitutional: Positive for fever.  HENT: Negative.   Respiratory: Positive for cough.   Cardiovascular: Negative.   Gastrointestinal: Negative.   Musculoskeletal: Negative.   Skin: Negative.   Allergic/Immunologic: Positive for immunocompromised state.  Neurological: Negative.   Psychiatric/Behavioral: Negative.   All other systems reviewed and are negative.    Physical Exam Updated Vital Signs BP 120/70 (BP Location: Left Arm)   Pulse (!) 125   Temp 101.2 F (38.4 C) (Rectal)   Resp 22   Ht '5\' 10"'$  (1.778 m)   Wt 187 lb (84.8 kg)   SpO2 98%   BMI 26.83 kg/m   Physical Exam  Constitutional: He appears well-developed and well-nourished.  HENT:  Head: Normocephalic and atraumatic.  Eyes: Conjunctivae are normal. Pupils are equal, round, and reactive to light.  Neck: Neck supple. No tracheal deviation present. No thyromegaly present.  Cardiovascular: Regular rhythm.   No murmur heard. Mildly tachycardic  Pulmonary/Chest: Effort normal and breath sounds normal.  Abdominal: Soft. Bowel sounds are normal. He exhibits no distension. There is no tenderness.  Musculoskeletal: Normal range of motion. He exhibits no edema or tenderness.  Neurological: He is alert. Coordination normal.  Skin: Skin is warm and dry. No rash noted.  Psychiatric: He has a normal mood and affect.  Nursing note and vitals reviewed.    ED Treatments / Results  Labs (all labs ordered are listed, but only abnormal results are displayed) Labs Reviewed  CULTURE, BLOOD (ROUTINE X 2)  CULTURE, BLOOD (ROUTINE X 2)  URINE CULTURE  COMPREHENSIVE METABOLIC PANEL  CBC WITH DIFFERENTIAL/PLATELET  URINALYSIS, ROUTINE W REFLEX MICROSCOPIC  I-STAT CG4 LACTIC ACID, ED    EKG  EKG Interpretation None       Radiology No results found.  Procedures Procedures (including critical care time)  Medications Ordered in  ED Medications  piperacillin-tazobactam (ZOSYN) IVPB 3.375 g (not administered)  vancomycin (VANCOCIN) IVPB 1000 mg/200 mL premix (not administered)    Chest x-ray viewed by me Results for orders placed or performed during the hospital encounter of 04/04/16  Comprehensive metabolic panel  Result Value Ref Range   Sodium 127 (L) 135 - 145 mmol/L   Potassium 3.5 3.5 - 5.1 mmol/L   Chloride 96 (L) 101 - 111 mmol/L   CO2 22 22 - 32 mmol/L   Glucose, Bld 183 (H) 65 - 99 mg/dL   BUN 15 6 - 20 mg/dL   Creatinine, Ser 0.78 0.61 - 1.24 mg/dL   Calcium 8.8 (L) 8.9 - 10.3 mg/dL   Total Protein 7.4 6.5 - 8.1 g/dL   Albumin 3.3 (L) 3.5 - 5.0 g/dL   AST 28 15 - 41  U/L   ALT 35 17 - 63 U/L   Alkaline Phosphatase 59 38 - 126 U/L   Total Bilirubin 0.4 0.3 - 1.2 mg/dL   GFR calc non Af Amer >60 >60 mL/min   GFR calc Af Amer >60 >60 mL/min   Anion gap 9 5 - 15  CBC WITH DIFFERENTIAL  Result Value Ref Range   WBC 11.2 (H) 4.0 - 10.5 K/uL   RBC 3.11 (L) 4.22 - 5.81 MIL/uL   Hemoglobin 8.9 (L) 13.0 - 17.0 g/dL   HCT 25.5 (L) 39.0 - 52.0 %   MCV 82.0 78.0 - 100.0 fL   MCH 28.6 26.0 - 34.0 pg   MCHC 34.9 30.0 - 36.0 g/dL   RDW 14.5 11.5 - 15.5 %   Platelets 480 (H) 150 - 400 K/uL   Neutrophils Relative % 64 %   Neutro Abs 7.2 1.7 - 7.7 K/uL   Lymphocytes Relative 7 %   Lymphs Abs 0.8 0.7 - 4.0 K/uL   Monocytes Relative 28 %   Monocytes Absolute 3.1 (H) 0.1 - 1.0 K/uL   Eosinophils Relative 1 %   Eosinophils Absolute 0.1 0.0 - 0.7 K/uL   Basophils Relative 0 %   Basophils Absolute 0.0 0.0 - 0.1 K/uL  Urinalysis, Routine w reflex microscopic  Result Value Ref Range   Color, Urine YELLOW YELLOW   APPearance CLEAR CLEAR   Specific Gravity, Urine 1.010 1.005 - 1.030   pH 7.0 5.0 - 8.0   Glucose, UA NEGATIVE NEGATIVE mg/dL   Hgb urine dipstick NEGATIVE NEGATIVE   Bilirubin Urine NEGATIVE NEGATIVE   Ketones, ur NEGATIVE NEGATIVE mg/dL   Protein, ur NEGATIVE NEGATIVE mg/dL   Nitrite  NEGATIVE NEGATIVE   Leukocytes, UA NEGATIVE NEGATIVE  I-Stat CG4 Lactic Acid, ED  (not at  [facility])  Result Value Ref Range   Lactic Acid, Venous 1.88 0.5 - 1.9 mmol/L   Dg Chest 2 View  Result Date: 04/05/2016 CLINICAL DATA:  Fever and cough for 4 days. History of stage IV lung cancer. EXAM: CHEST  2 VIEW COMPARISON:  Chest radiograph March 24, 2016 and priors. FINDINGS: Patchy airspace opacities RIGHT lung. Bronchitic changes without pleural effusion. Emphysema. Cardiac silhouette is normal. Fullness of the RIGHT hilum corresponding to known lymphadenopathy, similar to recent radiograph. No pneumothorax. Soft tissue planes and included osseous structures are nonsuspicious. IMPRESSION: Patchy airspace opacities RIGHT lung concerning for pneumonia versus treatment related changes. Emphysema and residual RIGHT hilar lymphadenopathy. Electronically Signed   By: Elon Alas M.D.   On: 04/05/2016 00:08   Dg Chest 2 View  Result Date: 03/24/2016 CLINICAL DATA:  Fever today.  History of lung cancer. EXAM: CHEST  2 VIEW COMPARISON:  CT chest 01/03/2016.  PA and lateral chest 02/18/2016. FINDINGS: The lungs are emphysematous. Mediastinal lymphadenopathy has improved. Right pleural effusion and basilar airspace disease seen on the most recent study have resolved. Heart size is normal. No pneumothorax. No focal bony abnormality. IMPRESSION: No acute disease. Marked improvement and mediastinal lymphadenopathy. Emphysema. Electronically Signed   By: Inge Rise M.D.   On: 03/24/2016 21:21   Initial Impression / Assessment and Plan / ED Course  I have reviewed the triage vital signs and the nursing notes.  Pertinent labs & imaging results that were available during my care of the patient were reviewed by me and considered in my medical decision making (see chart for details).  Clinical Course     Code sepsis called based on Sirs criteria  of fever, tachycardia, tachypnea. Source of infection is  unclear. In light of cough and fever we'll treat for pneumonia. 12 midnight Patient resting comfortably after treatment with antibiotics.anemia is chronic. Hyponatremia is chronic. Dr. Alcario Drought consulted plan admit medical surgical floor Final Clinical Impressions(s) / ED Diagnoses  Dx #1 community-acquired pneumonia #2 anemia #3 hyponatremia #4 hyperglycemia Final diagnoses:  None    New Prescriptions New Prescriptions   No medications on file     Orlie Dakin, MD 04/05/16 3643

## 2016-04-05 ENCOUNTER — Encounter (HOSPITAL_COMMUNITY): Payer: Self-pay | Admitting: *Deleted

## 2016-04-05 DIAGNOSIS — Z87891 Personal history of nicotine dependence: Secondary | ICD-10-CM | POA: Diagnosis not present

## 2016-04-05 DIAGNOSIS — C3481 Malignant neoplasm of overlapping sites of right bronchus and lung: Secondary | ICD-10-CM | POA: Diagnosis not present

## 2016-04-05 DIAGNOSIS — Z923 Personal history of irradiation: Secondary | ICD-10-CM | POA: Diagnosis not present

## 2016-04-05 DIAGNOSIS — Z9221 Personal history of antineoplastic chemotherapy: Secondary | ICD-10-CM | POA: Diagnosis not present

## 2016-04-05 DIAGNOSIS — J189 Pneumonia, unspecified organism: Secondary | ICD-10-CM | POA: Diagnosis present

## 2016-04-05 DIAGNOSIS — J181 Lobar pneumonia, unspecified organism: Secondary | ICD-10-CM | POA: Diagnosis not present

## 2016-04-05 DIAGNOSIS — C3490 Malignant neoplasm of unspecified part of unspecified bronchus or lung: Secondary | ICD-10-CM | POA: Diagnosis present

## 2016-04-05 DIAGNOSIS — Z9889 Other specified postprocedural states: Secondary | ICD-10-CM | POA: Diagnosis not present

## 2016-04-05 DIAGNOSIS — Z7982 Long term (current) use of aspirin: Secondary | ICD-10-CM | POA: Diagnosis not present

## 2016-04-05 DIAGNOSIS — I10 Essential (primary) hypertension: Secondary | ICD-10-CM | POA: Diagnosis present

## 2016-04-05 DIAGNOSIS — Z79899 Other long term (current) drug therapy: Secondary | ICD-10-CM | POA: Diagnosis not present

## 2016-04-05 DIAGNOSIS — Z8249 Family history of ischemic heart disease and other diseases of the circulatory system: Secondary | ICD-10-CM | POA: Diagnosis not present

## 2016-04-05 LAB — BASIC METABOLIC PANEL
Anion gap: 7 (ref 5–15)
BUN: 12 mg/dL (ref 6–20)
CHLORIDE: 101 mmol/L (ref 101–111)
CO2: 25 mmol/L (ref 22–32)
Calcium: 8.6 mg/dL — ABNORMAL LOW (ref 8.9–10.3)
Creatinine, Ser: 0.54 mg/dL — ABNORMAL LOW (ref 0.61–1.24)
GFR calc Af Amer: >60 mL/min (ref >60)
GFR calc non Af Amer: >60 mL/min (ref >60)
GLUCOSE: 105 mg/dL — AB (ref 65–99)
Potassium: 4.2 mmol/L (ref 3.5–5.1)
Sodium: 133 mmol/L — ABNORMAL LOW (ref 135–145)

## 2016-04-05 LAB — I-STAT CG4 LACTIC ACID, ED: LACTIC ACID, VENOUS: 1.07 mmol/L (ref 0.5–1.9)

## 2016-04-05 LAB — HIV ANTIBODY (ROUTINE TESTING W REFLEX): HIV Screen 4th Generation wRfx: NONREACTIVE

## 2016-04-05 MED ORDER — ACETAMINOPHEN 500 MG PO TABS
1000.0000 mg | ORAL_TABLET | Freq: Four times a day (QID) | ORAL | Status: DC | PRN
  Administered 2016-04-05: 22:00:00 1000 mg via ORAL
  Filled 2016-04-05: qty 2

## 2016-04-05 MED ORDER — GABAPENTIN 300 MG PO CAPS: 300.0000 mg | ORAL_CAPSULE | Freq: Two times a day (BID) | ORAL | Status: DC | PRN

## 2016-04-05 MED ORDER — PANTOPRAZOLE SODIUM 40 MG PO TBEC
40.0000 mg | DELAYED_RELEASE_TABLET | Freq: Every day | ORAL | Status: DC
  Administered 2016-04-05 – 2016-04-06 (×2): 40 mg via ORAL
  Filled 2016-04-05 (×2): qty 1

## 2016-04-05 MED ORDER — ASPIRIN EC 81 MG PO TBEC
81.0000 mg | DELAYED_RELEASE_TABLET | Freq: Every morning | ORAL | Status: DC
Start: 2016-04-05 — End: 2016-04-06
  Administered 2016-04-05 – 2016-04-06 (×2): 81 mg via ORAL
  Filled 2016-04-05 (×2): qty 1

## 2016-04-05 MED ORDER — TAMSULOSIN HCL 0.4 MG PO CAPS
0.8000 mg | ORAL_CAPSULE | Freq: Every day | ORAL | Status: DC
  Administered 2016-04-05 – 2016-04-06 (×2): 0.8 mg via ORAL
  Filled 2016-04-05 (×2): qty 2

## 2016-04-05 MED ORDER — MAGIC MOUTHWASH
10.0000 mL | Freq: Two times a day (BID) | ORAL | Status: DC
  Administered 2016-04-05 – 2016-04-06 (×3): 10 mL via ORAL
  Filled 2016-04-05 (×3): qty 10

## 2016-04-05 MED ORDER — ALBUTEROL SULFATE (2.5 MG/3ML) 0.083% IN NEBU: 2.5000 mg | INHALATION_SOLUTION | Freq: Four times a day (QID) | RESPIRATORY_TRACT | Status: DC | PRN

## 2016-04-05 MED ORDER — ONDANSETRON HCL 4 MG PO TABS: 8.0000 mg | ORAL_TABLET | Freq: Three times a day (TID) | ORAL | Status: DC | PRN

## 2016-04-05 MED ORDER — AMLODIPINE BESYLATE 5 MG PO TABS
5.0000 mg | ORAL_TABLET | Freq: Every day | ORAL | Status: DC
  Administered 2016-04-05 – 2016-04-06 (×2): 5 mg via ORAL
  Filled 2016-04-05 (×2): qty 1

## 2016-04-05 MED ORDER — SUCRALFATE 1 G PO TABS
1.0000 g | ORAL_TABLET | Freq: Four times a day (QID) | ORAL | Status: DC
  Administered 2016-04-05 – 2016-04-06 (×5): 1 g via ORAL
  Filled 2016-04-05 (×5): qty 1

## 2016-04-05 MED ORDER — BENZONATATE 100 MG PO CAPS
100.0000 mg | ORAL_CAPSULE | Freq: Three times a day (TID) | ORAL | Status: DC | PRN
  Administered 2016-04-05 – 2016-04-06 (×3): 100 mg via ORAL
  Filled 2016-04-05 (×3): qty 1

## 2016-04-05 MED ORDER — ENSURE ENLIVE PO LIQD
237.0000 mL | Freq: Two times a day (BID) | ORAL | Status: DC
  Administered 2016-04-05 – 2016-04-06 (×3): 237 mL via ORAL

## 2016-04-05 MED ORDER — ENOXAPARIN SODIUM 40 MG/0.4ML ~~LOC~~ SOLN
40.0000 mg | SUBCUTANEOUS | Status: DC
  Administered 2016-04-05 – 2016-04-06 (×2): 40 mg via SUBCUTANEOUS
  Filled 2016-04-05 (×2): qty 0.4

## 2016-04-05 MED ORDER — ADULT MULTIVITAMIN W/MINERALS CH
1.0000 | ORAL_TABLET | Freq: Every morning | ORAL | Status: DC
  Administered 2016-04-05 – 2016-04-06 (×2): 1 via ORAL
  Filled 2016-04-05 (×2): qty 1

## 2016-04-05 NOTE — ED Notes (Signed)
Gave report to [place], Therapist, sports.

## 2016-04-05 NOTE — ED Notes (Signed)
Pt. Refused lab draw, pt. Wanted labs to be drawn from IV site. RN,Joanna made aware.

## 2016-04-05 NOTE — H&P (Signed)
History and Physical    Gregory Chang KXF:[id number] DOB: 02-01-1956 DOA: 04/04/2016   PCP: No primary care provider on file. Chief Complaint:  Chief Complaint  Patient presents with  . Code Sepsis    HPI: Gregory Chang is a 60 y.o. male with medical history significant of NSCLC of the RLL, s/p radiation, ongoing chemo.  Patient presents to the ED with c/o fever.  Had fever daily for past 3 weeks.  Has slight cough that has been worsening over the past few days.  This is unchanged.  T at home PTA this evening was 103 took tylenol (still 101.2 here in ED).  No other symptoms, nothing makes symptoms worse.  ED Course: CXR suggestive of PNA vs radiation changes.  Review of Systems: As per HPI otherwise 10 point review of systems negative.    Past Medical History:  Diagnosis Date  . Hypertension   . 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 Neuropathy [facility])     Past Surgical History:  Procedure Laterality Date  . SPINE SURGERY       reports that he has quit smoking. He has a 45.00 pack-year smoking history. He has never used smokeless tobacco. He reports that he uses drugs, including Marijuana, about 1 time per week. He reports that he does not drink alcohol.  No Known Allergies  Family History  Problem Relation Age of Onset  . Hypertension Brother       Prior to Admission medications   Medication Sig Start Date End Date Taking? Authorizing Provider  acetaminophen (TYLENOL) 500 MG tablet Take 1,000 mg by mouth every 6 (six) hours as needed for mild pain, moderate pain or fever.   Yes Historical Provider, MD  amLODipine (NORVASC) 5 MG tablet Take 5 mg by mouth every morning.    Yes Historical Provider, MD  aspirin EC 81 MG tablet Take 81 mg by mouth every morning.   Yes Historical Provider, MD  magic mouthwash SOLN Take 10 mLs by mouth 2 (two) times daily.   Yes Historical Provider, MD  Multiple Vitamin (MULTIVITAMIN WITH  MINERALS) TABS tablet Take 1 tablet by mouth every morning.   Yes Historical Provider, MD  omeprazole (PRILOSEC) 20 MG capsule Take 20 mg by mouth 2 (two) times daily before a meal.   Yes Historical Provider, MD  sucralfate (CARAFATE) 1 g tablet Take 1 tablet (1 g total) by mouth 4 (four) times daily. 03/03/16  Yes Kyung Rudd, MD  tamsulosin (FLOMAX) 0.4 MG CAPS capsule Take 0.8 mg by mouth daily after breakfast.    Yes Historical Provider, MD  vitamin C (ASCORBIC ACID) 500 MG tablet Take 500 mg by mouth every morning.    Yes Historical Provider, MD  albuterol (PROVENTIL HFA;VENTOLIN HFA) 108 (90 Base) MCG/ACT inhaler Inhale into the lungs every 6 (six) hours as needed for wheezing or shortness of breath.    Historical Provider, MD  gabapentin (NEURONTIN) 300 MG capsule Take 300 mg by mouth 2 (two) times daily as needed (NERVE PAIN).    Historical Provider, MD  ondansetron (ZOFRAN) 8 MG tablet Take 8 mg by mouth every 8 (eight) hours as needed for nausea or vomiting. TAKE 2 TABLETS X 2 DAYS FOLLOWING CHEMO    Historical Provider, MD    Physical Exam: Vitals:   04/04/16 2257 04/04/16 2300 04/04/16 2330 04/05/16 0040  BP:  102/63 108/61 127/67  Pulse:  87 84 86  Resp:  (!) 31 (!)  29 19  Temp: 100 F (37.8 C)   99 F (37.2 C)  TempSrc: Rectal   Oral  SpO2:  98% 97% 100%  Weight:      Height:          Constitutional: NAD, calm, comfortable Eyes: PERRL, lids and conjunctivae normal ENMT: Mucous membranes are moist. Posterior pharynx clear of any exudate or lesions.Normal dentition.  Neck: normal, supple, no masses, no thyromegaly Respiratory: clear to auscultation bilaterally, no wheezing, no crackles. Normal respiratory effort. No accessory muscle use.  Cardiovascular: Regular rate and rhythm, no murmurs / rubs / gallops. No extremity edema. 2+ pedal pulses. No carotid bruits.  Abdomen: no tenderness, no masses palpated. No hepatosplenomegaly. Bowel sounds positive.  Musculoskeletal: no  clubbing / cyanosis. No joint deformity upper and lower extremities. Good ROM, no contractures. Normal muscle tone.  Skin: no rashes, lesions, ulcers. No induration Neurologic: CN 2-12 grossly intact. Sensation intact, DTR normal. Strength 5/5 in all 4.  Psychiatric: Normal judgment and insight. Alert and oriented x 3. Normal mood.    Labs on Admission: I have personally reviewed following labs and imaging studies  CBC:  Recent Labs Lab 04/04/16 2156  WBC 11.2*  NEUTROABS 7.2  HGB 8.9*  HCT 25.5*  MCV 82.0  PLT 993*   Basic Metabolic Panel:  Recent Labs Lab 04/04/16 2156  NA 127*  K 3.5  CL 96*  CO2 22  GLUCOSE 183*  BUN 15  CREATININE 0.78  CALCIUM 8.8*   GFR: Estimated Creatinine Clearance: 98.9 mL/min (by C-G formula based on SCr of 0.78 mg/dL). Liver Function Tests:  Recent Labs Lab 04/04/16 2156  AST 28  ALT 35  ALKPHOS 59  BILITOT 0.4  PROT 7.4  ALBUMIN 3.3*   No results for input(s): LIPASE, AMYLASE in the last 168 hours. No results for input(s): AMMONIA in the last 168 hours. Coagulation Profile: No results for input(s): INR, PROTIME in the last 168 hours. Cardiac Enzymes: No results for input(s): CKTOTAL, CKMB, CKMBINDEX, TROPONINI in the last 168 hours. BNP (last 3 results) No results for input(s): PROBNP in the last 8760 hours. HbA1C: No results for input(s): HGBA1C in the last 72 hours. CBG: No results for input(s): GLUCAP in the last 168 hours. Lipid Profile: No results for input(s): CHOL, HDL, LDLCALC, TRIG, CHOLHDL, LDLDIRECT in the last 72 hours. Thyroid Function Tests: No results for input(s): TSH, T4TOTAL, FREET4, T3FREE, THYROIDAB in the last 72 hours. Anemia Panel: No results for input(s): VITAMINB12, FOLATE, FERRITIN, TIBC, IRON, RETICCTPCT in the last 72 hours. Urine analysis:    Component Value Date/Time   COLORURINE YELLOW 04/04/2016 2003   APPEARANCEUR CLEAR 04/04/2016 2003   LABSPEC 1.010 04/04/2016 2003   PHURINE 7.0  04/04/2016 2003   GLUCOSEU NEGATIVE 04/04/2016 2003   HGBUR NEGATIVE 04/04/2016 2003   [place] NEGATIVE 04/04/2016 2003   BILIRUBINUR neg 06/09/2014 [place] 04/04/2016 2003   PROTEINUR NEGATIVE 04/04/2016 2003   UROBILINOGEN 0.2 06/09/2014 1527   NITRITE NEGATIVE 04/04/2016 2003   LEUKOCYTESUR NEGATIVE 04/04/2016 2003   Sepsis Labs: '@LABRCNTIP'$ (procalcitonin:4,lacticidven:4) )No results found for this or any previous visit (from the past 240 hour(s)).   Radiological Exams on Admission: Dg Chest 2 View  Result Date: 04/05/2016 CLINICAL DATA:  Fever and cough for 4 days. History of stage IV lung cancer. EXAM: CHEST  2 VIEW COMPARISON:  Chest radiograph March 24, 2016 and priors. FINDINGS: Patchy airspace opacities RIGHT lung. Bronchitic changes without pleural effusion. Emphysema. Cardiac  silhouette is normal. Fullness of the RIGHT hilum corresponding to known lymphadenopathy, similar to recent radiograph. No pneumothorax. Soft tissue planes and included osseous structures are nonsuspicious. IMPRESSION: Patchy airspace opacities RIGHT lung concerning for pneumonia versus treatment related changes. Emphysema and residual RIGHT hilar lymphadenopathy. Electronically Signed   By: Elon Alas M.D.   On: 04/05/2016 00:08    EKG: Independently reviewed.  Assessment/Plan Principal Problem:   CAP (community acquired pneumonia) Active Problems:   Malignant neoplasm of overlapping sites of right lung ([place])    1. CAP - with associated NSCLC of that lung 1. Given ongoing chemo will continue treat broad specturm with cefepime and vanc started in ED initially 2. Tylenol PRN fever 3. PNA pathway 4. Cultures pending 2. NSCLC - next chemo is supposed to be done on Tuesday (though this may be delayed).  Heme/onc is the [place] system (rad onc which he completed, is our system).   DVT prophylaxis: Lovenox Code Status: Full Family Communication: Wife at bedside Consults  called: None Admission status: Admit to inpatient   Etta Quill DO [facility] Pager [phone] from 7PM-7AM  If 7AM-7PM, please contact the day physician for the patient [email or web address] Password [email or web address]  04/05/2016, 12:52 AM

## 2016-04-05 NOTE — Progress Notes (Signed)
Patient arrived on the unit at approximately 0155 accompanied by spouse. Patient in no obvious cardiopulmonary distress and voiced no complaints at this time. Spouse staying overnight with patient.

## 2016-04-05 NOTE — Progress Notes (Signed)
Gregory Chang is a 60 y.o. male with medical history significant of NSCLC of the RLL, s/p radiation, with  ongoing chemo presents to the ED with c/o fever and cough , was found to have Patchy airspace opacities RIGHT lung concerning for pneumonia on CXR. He was admitted for management of pneumonia.  Currently not requiring oxygen.  Monitor on IV antibiotics.    Hosie Poisson, MD [phone]

## 2016-04-06 DIAGNOSIS — J181 Lobar pneumonia, unspecified organism: Secondary | ICD-10-CM

## 2016-04-06 DIAGNOSIS — C3481 Malignant neoplasm of overlapping sites of right bronchus and lung: Secondary | ICD-10-CM

## 2016-04-06 LAB — URINE CULTURE

## 2016-04-06 MED ORDER — PHENOL 1.4 % MT LIQD
1.0000 | OROMUCOSAL | Status: DC | PRN
  Filled 2016-04-06: qty 177

## 2016-04-06 MED ORDER — CIPROFLOXACIN HCL 500 MG PO TABS: 500.0000 mg | ORAL_TABLET | Freq: Two times a day (BID) | ORAL | 0 refills | Status: DC

## 2016-04-06 MED ORDER — AZITHROMYCIN 500 MG PO TABS: 500.0000 mg | ORAL_TABLET | Freq: Every day | ORAL | 0 refills | Status: DC

## 2016-04-06 MED ORDER — AMOXICILLIN 875 MG PO TABS: 875.0000 mg | ORAL_TABLET | Freq: Three times a day (TID) | ORAL | 0 refills | Status: AC

## 2016-04-06 NOTE — Discharge Summary (Signed)
Physician Discharge Summary  Gregory Chang:[id number] DOB: 01-08-1956 DOA: 04/04/2016  PCP: No primary care provider on file.  Admit date: 04/04/2016 Discharge date: 04/06/2016  Time spent: 25* minutes  Recommendations for Outpatient Follow-up:  1. Follow up [place] [facility] in one week   Discharge Diagnoses:  Principal Problem:   CAP (community acquired pneumonia) Active Problems:   Malignant neoplasm of overlapping sites of right lung [facility])   Discharge Condition: Stable  Diet recommendation: Regular diet  Filed Weights   04/04/16 2121 04/05/16 0200  Weight: 84.8 kg (187 lb) 82.9 kg (182 lb 12.8 oz)    History of present illness:  60 y.o. male with medical history significant of NSCLC of the RLL, s/p radiation, ongoing chemo.  Patient presents to the ED with c/o fever.  Had fever daily for past 3 weeks.  Has slight cough that has been worsening over the past few days.  This is unchanged.  T at home PTA this evening was 103 took tylenol (still 101.2 here in ED).  No other symptoms, nothing makes symptoms worse.   Hospital Course:   Community acquired pneumonia- improved , afebrile , normal wbc, blood culture x 2 negative to date. He has low grade fever, secondary to chemo as per patient. Will discharge on Amoxicillin and zithromax ( patient cannot afford other antibiotics). Lactic acid is 1.07  NSCLC - next chemo is supposed to be done on Tuesday (though this may be delayed).  Heme/onc is the [place] system (rad onc which he completed, is our system).   Procedures:  None   Consultations:  None   Discharge Exam: Vitals:   04/05/16 2152 04/06/16 0642  BP: 127/77 125/63  Pulse: 94 88  Resp: 20 20  Temp: (!) 100.9 F (38.3 C) 99.8 F (37.7 C)    General: Appears in no acute distress Cardiovascular: RRR, S1S2 Respiratory: Clear bilaterally  Discharge Instructions   Discharge Instructions    Diet - low sodium heart healthy    Complete by:  As directed     Increase activity slowly    Complete by:  As directed      Current Discharge Medication List    START taking these medications   Details  amoxicillin (AMOXIL) 875 MG tablet Take 1 tablet (875 mg total) by mouth 3 (three) times daily. Qty: 15 tablet, Refills: 0    azithromycin (ZITHROMAX) 500 MG tablet Take 1 tablet (500 mg total) by mouth daily. Qty: 5 tablet, Refills: 0      CONTINUE these medications which have NOT CHANGED   Details  acetaminophen (TYLENOL) 500 MG tablet Take 1,000 mg by mouth every 6 (six) hours as needed for mild pain, moderate pain or fever.    amLODipine (NORVASC) 5 MG tablet Take 5 mg by mouth every morning.     aspirin EC 81 MG tablet Take 81 mg by mouth every morning.    magic mouthwash SOLN Take 10 mLs by mouth 2 (two) times daily.    Multiple Vitamin (MULTIVITAMIN WITH MINERALS) TABS tablet Take 1 tablet by mouth every morning.    omeprazole (PRILOSEC) 20 MG capsule Take 20 mg by mouth 2 (two) times daily before a meal.    sucralfate (CARAFATE) 1 g tablet Take 1 tablet (1 g total) by mouth 4 (four) times daily. Qty: 120 tablet, Refills: 2    tamsulosin (FLOMAX) 0.4 MG CAPS capsule Take 0.8 mg by mouth daily after breakfast.     vitamin C (ASCORBIC ACID) 500  MG tablet Take 500 mg by mouth every morning.     albuterol (PROVENTIL HFA;VENTOLIN HFA) 108 (90 Base) MCG/ACT inhaler Inhale into the lungs every 6 (six) hours as needed for wheezing or shortness of breath.    gabapentin (NEURONTIN) 300 MG capsule Take 300 mg by mouth 2 (two) times daily as needed (NERVE PAIN).    ondansetron (ZOFRAN) 8 MG tablet Take 8 mg by mouth every 8 (eight) hours as needed for nausea or vomiting. TAKE 2 TABLETS X 2 DAYS FOLLOWING CHEMO       No Known Allergies    The results of significant diagnostics from this hospitalization (including imaging, microbiology, ancillary and laboratory) are listed below for reference.    Significant Diagnostic Studies: Dg Chest  2 View  Result Date: 04/05/2016 CLINICAL DATA:  Fever and cough for 4 days. History of stage IV lung cancer. EXAM: CHEST  2 VIEW COMPARISON:  Chest radiograph March 24, 2016 and priors. FINDINGS: Patchy airspace opacities RIGHT lung. Bronchitic changes without pleural effusion. Emphysema. Cardiac silhouette is normal. Fullness of the RIGHT hilum corresponding to known lymphadenopathy, similar to recent radiograph. No pneumothorax. Soft tissue planes and included osseous structures are nonsuspicious. IMPRESSION: Patchy airspace opacities RIGHT lung concerning for pneumonia versus treatment related changes. Emphysema and residual RIGHT hilar lymphadenopathy. Electronically Signed   By: Elon Alas M.D.   On: 04/05/2016 00:08   Dg Chest 2 View  Result Date: 03/24/2016 CLINICAL DATA:  Fever today.  History of lung cancer. EXAM: CHEST  2 VIEW COMPARISON:  CT chest 01/03/2016.  PA and lateral chest 02/18/2016. FINDINGS: The lungs are emphysematous. Mediastinal lymphadenopathy has improved. Right pleural effusion and basilar airspace disease seen on the most recent study have resolved. Heart size is normal. No pneumothorax. No focal bony abnormality. IMPRESSION: No acute disease. Marked improvement and mediastinal lymphadenopathy. Emphysema. Electronically Signed   By: Inge Rise M.D.   On: 03/24/2016 21:21    Microbiology: Recent Results (from the past 240 hour(s))  Urine culture     Status: Abnormal   Collection Time: 04/04/16  8:03 PM  Result Value Ref Range Status   Specimen Description URINE, RANDOM  Final   Special Requests NONE  Final   Culture MULTIPLE SPECIES PRESENT, SUGGEST RECOLLECTION (A)  Final   Report Status 04/06/2016 FINAL  Final     Labs: Basic Metabolic Panel:  Recent Labs Lab 04/04/16 2156 04/05/16 0218  NA 127* 133*  K 3.5 4.2  CL 96* 101  CO2 22 25  GLUCOSE 183* 105*  BUN 15 12  CREATININE 0.78 0.54*  CALCIUM 8.8* 8.6*   Liver Function  Tests:  Recent Labs Lab 04/04/16 2156  AST 28  ALT 35  ALKPHOS 59  BILITOT 0.4  PROT 7.4  ALBUMIN 3.3*   No results for input(s): LIPASE, AMYLASE in the last 168 hours. No results for input(s): AMMONIA in the last 168 hours. CBC:  Recent Labs Lab 04/04/16 2156  WBC 11.2*  NEUTROABS 7.2  HGB 8.9*  HCT 25.5*  MCV 82.0  PLT 480*     Signed:  Jerrick Farve S MD.  [facility] 04/06/2016, 11:20 AM

## 2016-04-06 NOTE — Progress Notes (Signed)
Discharged from floor via w/c for transport home by car. Belongings & wife with pt. No changes in assessment. Gregory Chang  

## 2016-04-10 LAB — CULTURE, BLOOD (ROUTINE X 2)
Culture: NO GROWTH
Culture: NO GROWTH

## 2016-04-15 ENCOUNTER — Encounter: Payer: Self-pay | Admitting: *Deleted

## 2016-04-15 ENCOUNTER — Ambulatory Visit
Admission: RE | Admit: 2016-04-15 | Discharge: 2016-04-15 | Disposition: A | Discharge status: Home / Self Care | Payer: Non-veteran care | Source: Ambulatory Visit | Attending: Radiation Oncology | Admitting: Radiation Oncology

## 2016-04-15 DIAGNOSIS — C3481 Malignant neoplasm of overlapping sites of right bronchus and lung: Secondary | ICD-10-CM

## 2016-04-15 NOTE — Progress Notes (Signed)
[place] and spoke with Mr. Karstens he ran a fever last night of 102.  Today his temperature was 101 this morning it went down to 99 and 98.7 around 1310.  He has a dry cough.  He was discharged on Vibramycin bid has been off the medication since Friday, 04-11-16 was treating for bronchitis.  Stated he spoke with his medical oncologist and was told to go to [place] where he is receiving his chemotherapy and also where they have all of his records and he plans to go to the [place] if his temperature goes back up again. Reported conversation to Shona Simpson, PA-C this afternoon.

## 2016-04-22 NOTE — Progress Notes (Deleted)
[place] and spoke with Gregory Chang he ran a fever last night of 102.  Today his temperature was 101 this morning it went down to 99 and 98.7 around 1310.  He has a dry cough.  He was discharged on Vibramycin bid has been off the medication since Friday, 04-11-16 was treating for bronchitis.  Stated he spoke with his medical oncologist and was told to go to [place] where he is receiving his chemotherapy and also where they have all of his records and he plans to go to the [place] if his temperature goes back up again. Reported conversation to Gregory Simpson, PA-C this afternoon.

## 2016-04-23 ENCOUNTER — Telehealth: Payer: Self-pay | Admitting: *Deleted

## 2016-04-23 ENCOUNTER — Ambulatory Visit
Admission: RE | Admit: 2016-04-23 | Discharge: 2016-04-23 | Disposition: A | Discharge status: Home / Self Care | Payer: Non-veteran care | Source: Ambulatory Visit | Attending: Radiation Oncology | Admitting: Radiation Oncology

## 2016-04-23 NOTE — Telephone Encounter (Signed)
CALLED PATIENT TO INFORM OF RESCHEDULED FU VISIT WITH ALISON PERKINS, NO ANSWER WILL CALL LATER.

## 2016-04-24 NOTE — Progress Notes (Signed)
[place] and spoke with Mr. Vitelli he ran a fever last night of 102.  Today his temperature was 101 this morning it went down to 99 and 98.7 around 1310.  He has a dry cough.  He was discharged on Vibramycin bid has been off the medication since Friday, 04-11-16 was treating for bronchitis.  Stated he spoke with his medical oncologist and was told to go to [place] where he is receiving his chemotherapy and also where they have all of his records and he plans to go to the [place] if his temperature goes back up again. Reported conversation to Shona Simpson, PA-C this afternoon.

## 2016-04-30 ENCOUNTER — Ambulatory Visit
Admission: RE | Admit: 2016-04-30 | Discharge: 2016-04-30 | Disposition: A | Discharge status: Home / Self Care | Payer: Non-veteran care | Source: Ambulatory Visit | Attending: Radiation Oncology | Admitting: Radiation Oncology

## 2016-04-30 ENCOUNTER — Encounter: Payer: Self-pay | Admitting: Radiation Oncology

## 2016-04-30 ENCOUNTER — Ambulatory Visit: Payer: Non-veteran care | Admitting: Radiation Oncology

## 2016-04-30 VITALS — BP 126/62 | HR 96 | Temp 98.4°F | Resp 18 | Ht 71.0 in | Wt 175.2 lb

## 2016-04-30 DIAGNOSIS — C3431 Malignant neoplasm of lower lobe, right bronchus or lung: Secondary | ICD-10-CM | POA: Insufficient documentation

## 2016-04-30 DIAGNOSIS — C3401 Malignant neoplasm of right main bronchus: Secondary | ICD-10-CM | POA: Diagnosis not present

## 2016-04-30 DIAGNOSIS — C3481 Malignant neoplasm of overlapping sites of right bronchus and lung: Secondary | ICD-10-CM

## 2016-04-30 NOTE — Progress Notes (Signed)
Mr. Gregory Chang 61 year old man with malignant neoplasm of overlapping sites of right lung completed 02-25-17 right lung one month FU.   Weight changes, if any: Wt Readings from Last 3 Encounters:  04/30/16 175 lb 3.2 oz (79.5 kg)  04/05/16 182 lb 12.8 oz (82.9 kg)  03/24/16 178 lb 9 oz (81 kg)   Respiratory complaints, if any: SOB,coughing white to clear secretion, occasional wheezing Reports he has had an infection since December 2017 and has been on and antibiotic.  Went to [facility] about lab work yesterday. Hemoptysis, if any:  No Swallowing Problems/Pain/Difficulty swallowing:Yes while eating a little with liquids. Smoking Tobacco/Marijuana/Snuff/ETOH use: Smoker smoker x 45 years marijuana no alcohol usuage Appetite :Forcing himself to eat,drinking ensure bid lunch and supper and carnation instant breakfast at breakfast Pain: None When is next chemo scheduled?:None Lab work from of chart: 03-24-16 CBC w diff, Cmet, PT INR BP 126/62   Pulse 96   Temp 98.4 F (36.9 C) (Oral)   Resp 18   Ht '5\' 11"'$  (1.803 m)   Wt 175 lb 3.2 oz (79.5 kg)   SpO2 99%   BMI 24.44 kg/m

## 2016-05-02 NOTE — Progress Notes (Signed)
Radiation Oncology         [phone] ________________________________  Name: Gregory Chang MRN: [id number]  Date: 04/30/2016  DOB: February 22, 1956  Post Treatment Note  CC: No primary care provider on file.  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 He tolerated treatment and completed chemotherapy on 03/24/16. He reports that since chemotherapy, he has had pneumonia and this has not completely improved. He has been on Amoxicillin, Levaquin, and was seen in the ED at the [place] in [place] last night with a fever of 103,a WBC count of 23,000, but clinically looked stable for discharge. He was sent home with a prescription for a 4th generation cepholosporin. He has resecheduled his apointments on several occasions with me due to his pneumonia. Of note has had CXR and CT scans confirming multiple opacities in the lungs concerning for multifocal pneumonia. His tumor site appears to be stable from 04/25/16 when the nurse from the [place] read the report by phone during this encounter.  In discussing his case with Dr. Leilani Chang in medical oncology at the [facility], he has not received any bone marrow stimulation to blame his WBC on. It is felt that he does not have any atypical pneumonia and has been tested for mycoplasma, and TB which were negative. He also has blood cultures pending from last night's ED visit.                         On review of systems, the patient states he continues to feel poorly. He denies any fever today, but states that he typically experiences this late in the day. He continues to have cough with clear/white sputum. He has occasional wheezing, and continues to have some dysphagia with solids and occasionally with liquids. He continues to force himself ot eat,  and drinks ensure BID. No other complaints are noted.  ALLERGIES:  has No Known Allergies.  Meds: Current Outpatient Prescriptions  Medication Sig Dispense Refill  . albuterol (PROVENTIL HFA;VENTOLIN HFA) 108 (90 Base) MCG/ACT inhaler Inhale into the lungs every 6 (six) hours as needed for wheezing or shortness of breath.    Marland Kitchen amLODipine (NORVASC) 5 MG tablet Take 5 mg by mouth every morning.     Marland Kitchen aspirin EC 81 MG tablet Take 81 mg by mouth every morning.    . cefpodoxime (VANTIN) 200 MG tablet Take 200 mg by mouth 2 (two) times daily.    Marland Kitchen gabapentin (NEURONTIN) 300 MG capsule Take 300 mg by mouth 2 (two) times daily as needed (NERVE PAIN).    . Multiple Vitamin (MULTIVITAMIN WITH MINERALS) TABS tablet Take 1 tablet by mouth every morning.    Marland Kitchen omeprazole (PRILOSEC) 20 MG capsule Take 20 mg by mouth 2 (two) times daily before a meal.    . sucralfate (CARAFATE) 1 g tablet Take 1 tablet (1 g total) by mouth 4 (four) times daily. 120 tablet 2  . tamsulosin (FLOMAX) 0.4 MG CAPS capsule Take 0.8 mg by mouth daily after breakfast.     . vitamin C (ASCORBIC ACID) 500 MG tablet Take 500 mg by mouth every morning.     Marland Kitchen acetaminophen (TYLENOL) 500 MG tablet Take 1,000 mg by mouth every 6 (six) hours  as needed for mild pain, moderate pain or fever.    Marland Kitchen amoxicillin (AMOXIL) 875 MG tablet Take 1 tablet (875 mg total) by mouth 3 (three) times daily. (Patient not taking: Reported on 04/30/2016) 15 tablet 0  . magic mouthwash SOLN Take 10 mLs by mouth 2 (two) times daily.    . ondansetron (ZOFRAN) 8 MG tablet Take 8 mg by mouth every 8 (eight) hours as needed for nausea or vomiting. TAKE 2 TABLETS X 2 DAYS FOLLOWING CHEMO     No current facility-administered medications for this encounter.     Physical Findings:  height is '5\' 11"'$  (1.803 m) and weight is 175 lb 3.2 oz (79.5 kg). His oral temperature is 98.4 F (36.9 C). His blood pressure is 126/62 and his pulse is 96. His respiration is 18 and oxygen  saturation is 99%.  '@FLOW'$  [[id number]]@ In general this is a chronically ill appearing caucasian male in no acute distress. He's alert and oriented x4 and appropriate throughout the examination. Cardiopulmonary assessment is negative for acute distress and and exhibits normal effort. Chest is clear in the left lung fields, but has decreased breath sounds in the right upper and mid field, and coarse breath sounds in the right lower lung field.  Lab Findings: Lab Results  Component Value Date   WBC 11.2 (H) 04/04/2016   HGB 8.9 (L) 04/04/2016   HCT 25.5 (L) 04/04/2016   MCV 82.0 04/04/2016   PLT 480 (H) 04/04/2016     Radiographic Findings: Dg Chest 2 View  Result Date: 04/05/2016 CLINICAL DATA:  Fever and cough for 4 days. History of stage IV lung cancer. EXAM: CHEST  2 VIEW COMPARISON:  Chest radiograph March 24, 2016 and priors. FINDINGS: Patchy airspace opacities RIGHT lung. Bronchitic changes without pleural effusion. Emphysema. Cardiac silhouette is normal. Fullness of the RIGHT hilum corresponding to known lymphadenopathy, similar to recent radiograph. No pneumothorax. Soft tissue planes and included osseous structures are nonsuspicious. IMPRESSION: Patchy airspace opacities RIGHT lung concerning for pneumonia versus treatment related changes. Emphysema and residual RIGHT hilar lymphadenopathy. Electronically Signed   By: Gregory Chang M.D.   On: 04/05/2016 00:08    Impression/Plan: 1. 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 The patient appears to have some stability of his disease based on the nurse's read by phone of his recent CT. We will follow up with him as needed moving forward, and he will closely follow with medical oncology. 2. Pneumonia. I have again discussed his case with Dr. Leilani Chang who is standing in for Dr. Manuella Chang and Dr. Merrie Chang, the patient's medical oncologists. It appears that he has a true infectious problem given his WBC count, fevers, and  lack of steroids or bone marrow stimulation. [facility] appreciate [facility], however the patient is counseled that if he starts to spike additional fevers despite this new antibiotic, he needs to return to the ED for hospital admission. He states agreement and understanding.     Carola Rhine, PAC

## 2016-07-06 DEATH — deceased

## 2018-08-19 IMAGING — CT CT OUTSIDE FILMS CHEST
3 of 4 series · 17 of 30 positions shown, 19 images · non-contrast
Comparison: none

[Series 2: without contrast · axial · non-contrast · 0.70mm/px · z∈[-304,-118]mm · 5 of 124 slices shown]
[im 25/124  lung]
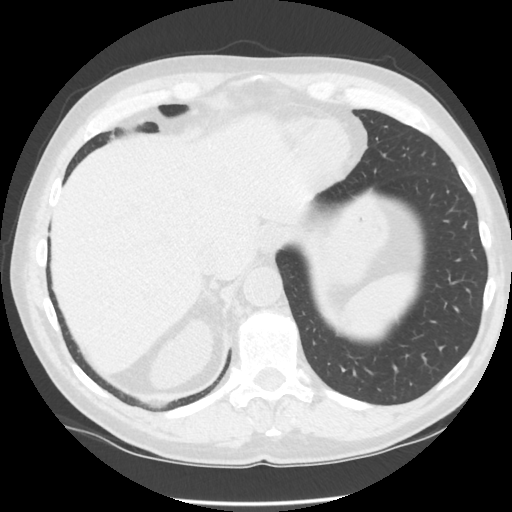
[im 50/124  lung]
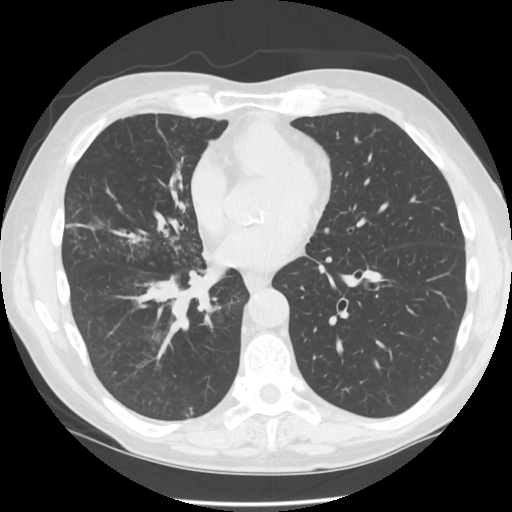
[im 64/124  lung]
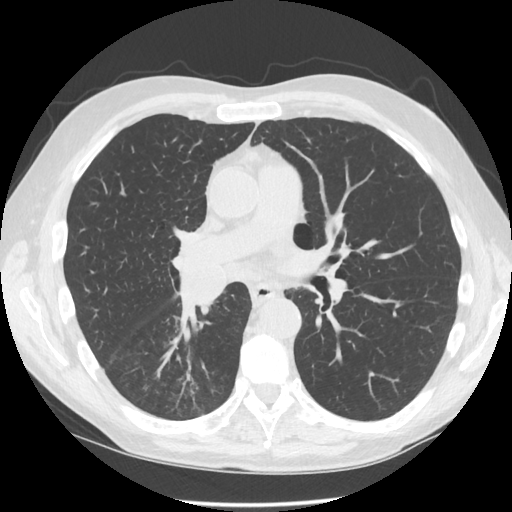
[im 74/124  lung]
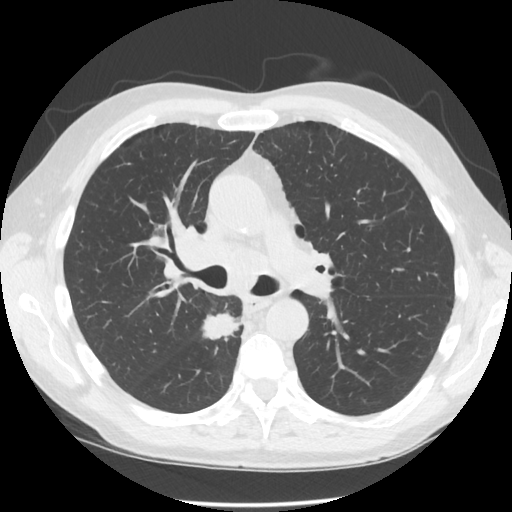
[im 99/124  lung]
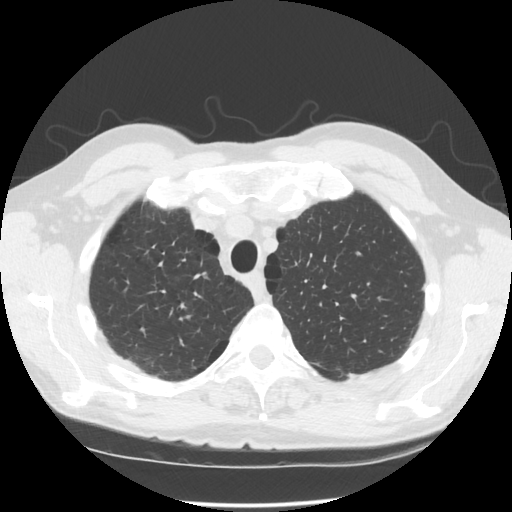

[Series 3: lung · axial · 0.70mm/px · z∈[-314,-108]mm · 6 of 124 slices shown, 8 images]
[im 21/124  mediastinal]
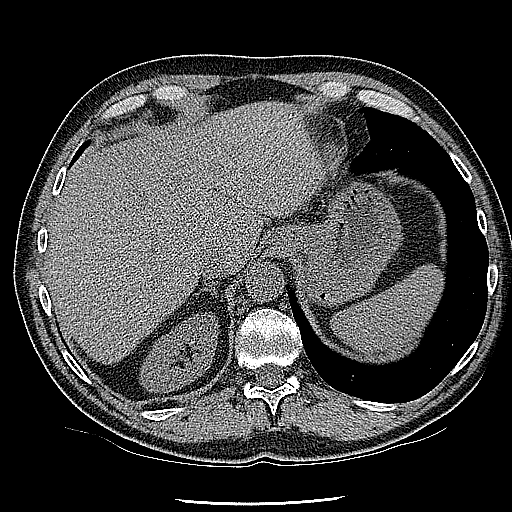
[im 21/124  lung]
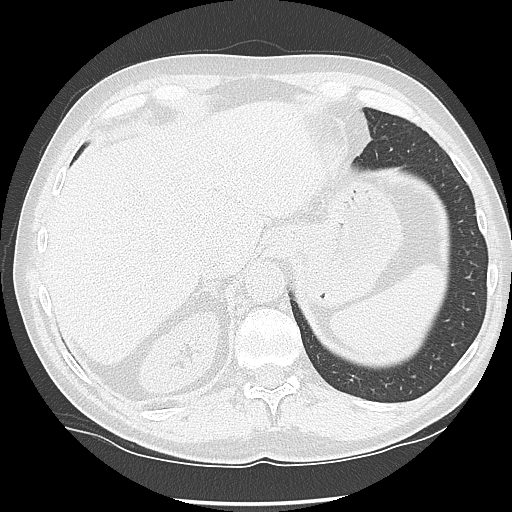
[im 42/124  lung]
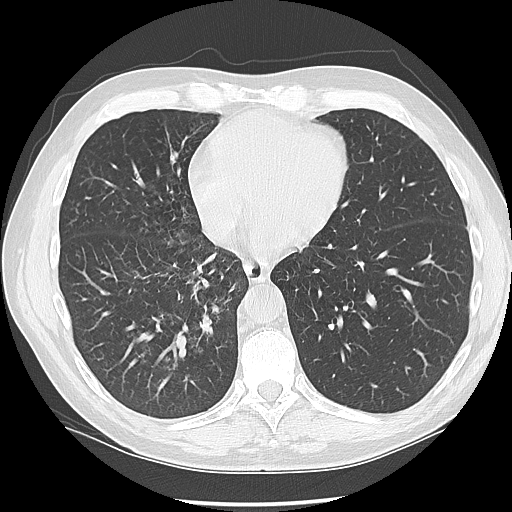
[im 62/124  lung]
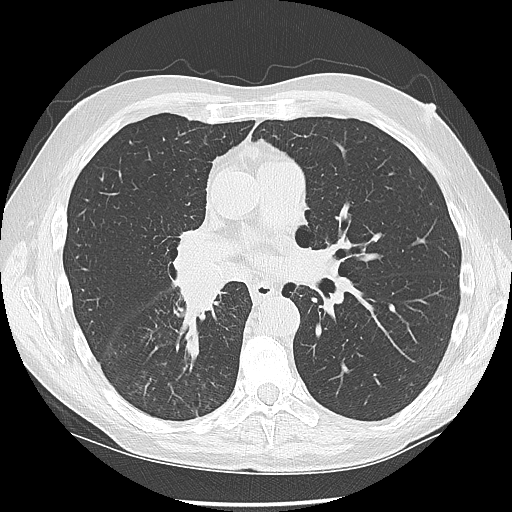
[im 64/124  lung]
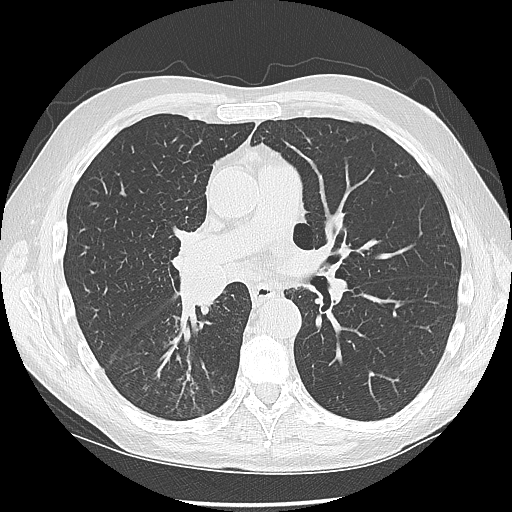
[im 83/124  mediastinal]
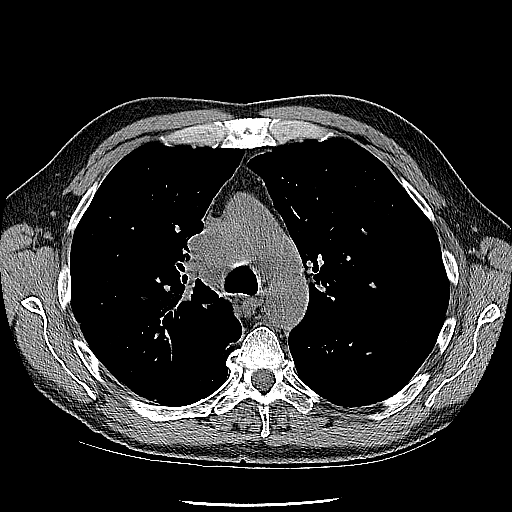
[im 83/124  lung]
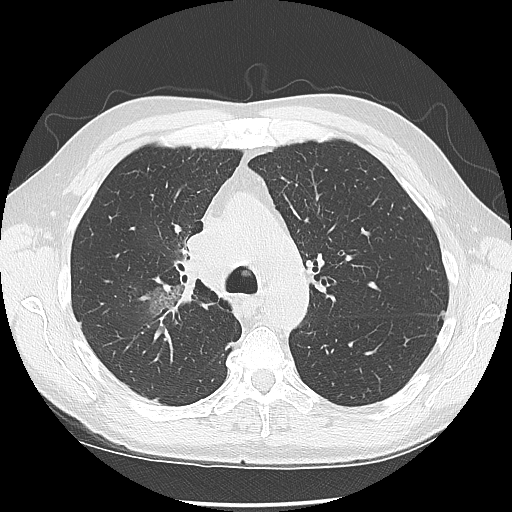
[im 103/124  lung]
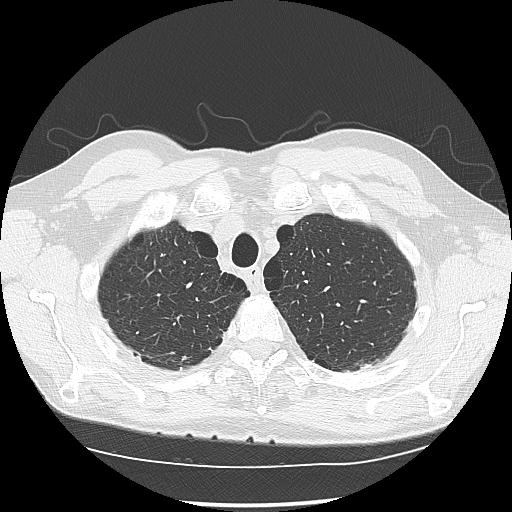

[Series 602: 2mm sag · sagittal · 0.70mm/px · 6 of 145 slices shown]
[im 21/145  lung]
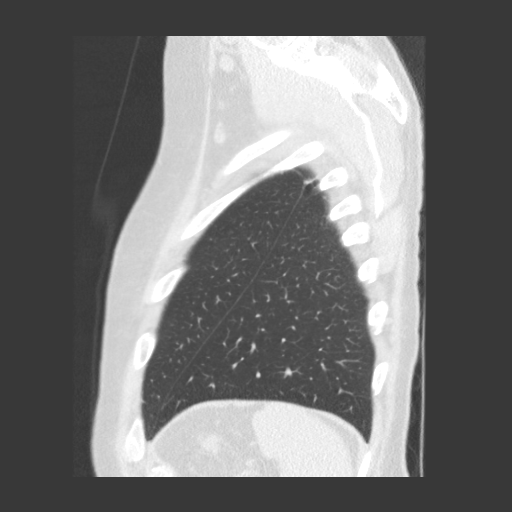
[im 42/145  lung]
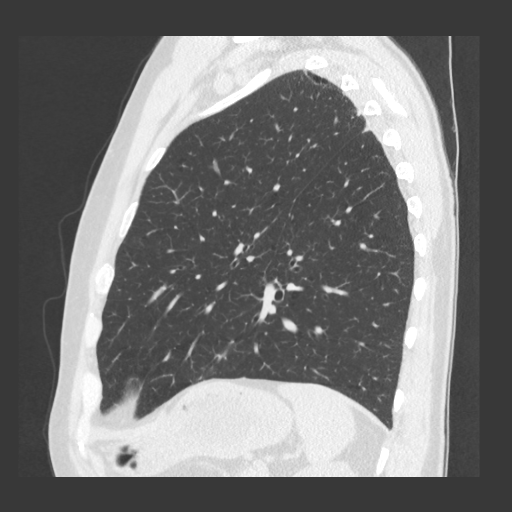
[im 62/145  lung]
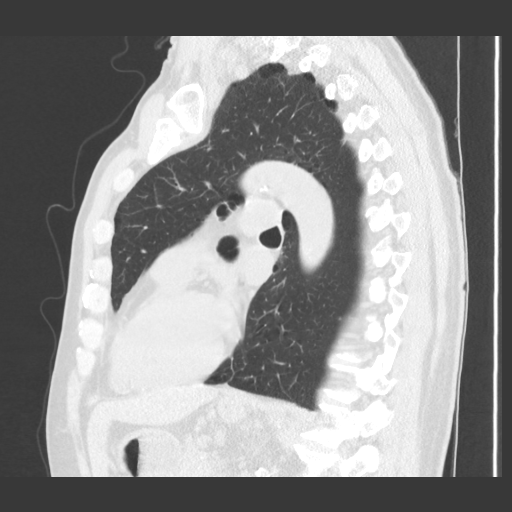
[im 83/145  lung]
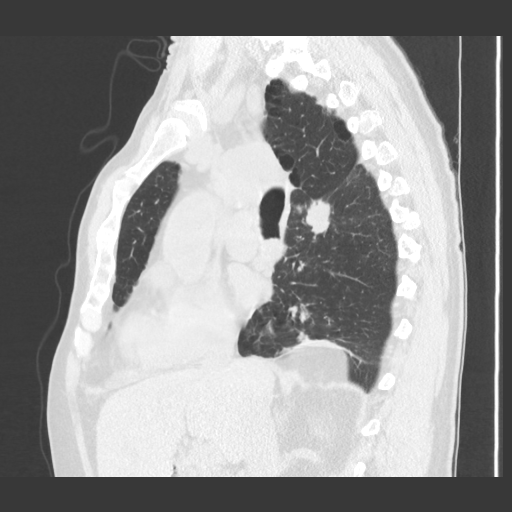
[im 103/145  lung]
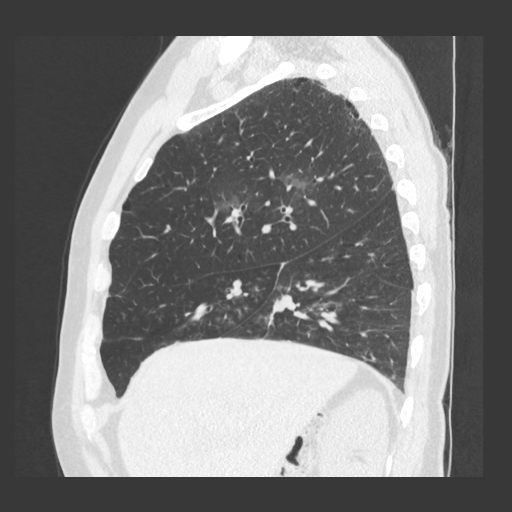
[im 124/145  lung]
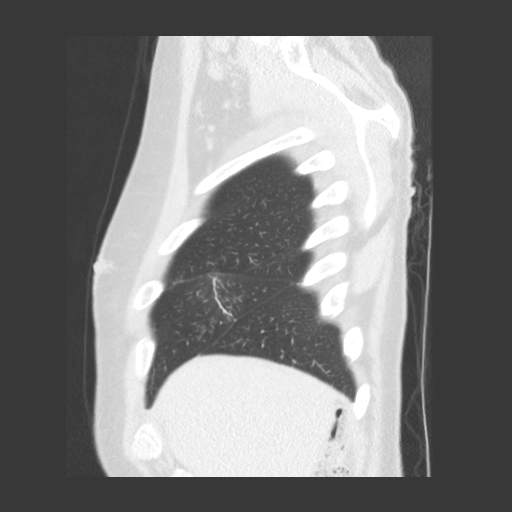

[17 of 30 positions shown; findings below may reference images not displayed]

Canned report from images found in remote index.

Refer to host system for actual result text.
